# Patient Record
Sex: Male | Born: 1955 | Race: White | Hispanic: No | Marital: Single | Smoking: Current every day smoker
Health system: Southern US, Community
[De-identification: ages and names within clinical notes are randomized; demographics above are authoritative.]

## PROBLEM LIST (undated history)

## (undated) DIAGNOSIS — J449 Chronic obstructive pulmonary disease, unspecified: Secondary | ICD-10-CM

## (undated) DIAGNOSIS — E119 Type 2 diabetes mellitus without complications: Secondary | ICD-10-CM

## (undated) DIAGNOSIS — J189 Pneumonia, unspecified organism: Secondary | ICD-10-CM

## (undated) DIAGNOSIS — E785 Hyperlipidemia, unspecified: Secondary | ICD-10-CM

## (undated) HISTORY — PX: TONSILLECTOMY: SUR1361

---

## 2002-08-04 ENCOUNTER — Encounter: Admission: RE | Admit: 2002-08-04 | Discharge: 2002-11-02 | Payer: Self-pay | Admitting: Family Medicine

## 2013-05-21 ENCOUNTER — Encounter: Payer: Self-pay | Admitting: Emergency Medicine

## 2013-05-21 ENCOUNTER — Emergency Department (INDEPENDENT_AMBULATORY_CARE_PROVIDER_SITE_OTHER): Payer: BC Managed Care – PPO

## 2013-05-21 ENCOUNTER — Emergency Department
Admission: EM | Admit: 2013-05-21 | Discharge: 2013-05-21 | Disposition: A | Discharge status: Home / Self Care | Payer: BC Managed Care – PPO | Source: Home / Self Care | Attending: Family Medicine | Admitting: Family Medicine

## 2013-05-21 DIAGNOSIS — J189 Pneumonia, unspecified organism: Secondary | ICD-10-CM

## 2013-05-21 DIAGNOSIS — R918 Other nonspecific abnormal finding of lung field: Secondary | ICD-10-CM

## 2013-05-21 DIAGNOSIS — J441 Chronic obstructive pulmonary disease with (acute) exacerbation: Secondary | ICD-10-CM

## 2013-05-21 DIAGNOSIS — Z716 Tobacco abuse counseling: Secondary | ICD-10-CM

## 2013-05-21 DIAGNOSIS — E785 Hyperlipidemia, unspecified: Secondary | ICD-10-CM | POA: Insufficient documentation

## 2013-05-21 HISTORY — DX: Hyperlipidemia, unspecified: E78.5

## 2013-05-21 HISTORY — DX: Type 2 diabetes mellitus without complications: E11.9

## 2013-05-21 MED ORDER — METHYLPREDNISOLONE SODIUM SUCC 125 MG IJ SOLR
125.0000 mg | Freq: Once | INTRAMUSCULAR | Status: AC
  Administered 2013-05-21: 125 mg via INTRAMUSCULAR

## 2013-05-21 MED ORDER — CEFTRIAXONE SODIUM 1 G IJ SOLR
1.0000 g | Freq: Once | INTRAMUSCULAR | Status: AC
  Administered 2013-05-21: 1 g via INTRAMUSCULAR

## 2013-05-21 MED ORDER — AZITHROMYCIN 250 MG PO TABS: ORAL_TABLET | ORAL | Status: DC

## 2013-05-21 MED ORDER — ALBUTEROL SULFATE HFA 108 (90 BASE) MCG/ACT IN AERS: 2.0000 | INHALATION_SPRAY | RESPIRATORY_TRACT | Status: DC | PRN

## 2013-05-21 MED ORDER — IPRATROPIUM-ALBUTEROL 0.5-2.5 (3) MG/3ML IN SOLN
3.0000 mL | Freq: Once | RESPIRATORY_TRACT | Status: AC
  Administered 2013-05-21: 3 mL via RESPIRATORY_TRACT

## 2013-05-21 MED ORDER — PREDNISONE 50 MG PO TABS: ORAL_TABLET | ORAL | Status: DC

## 2013-05-21 NOTE — ED Provider Notes (Signed)
CSN: [id number]     Arrival date & time 05/21/13  1351 History   First MD Initiated Contact with Patient 05/21/13 1355     Chief Complaint  Patient presents with  . Fever    x 3 days  . Generalized Body Aches    x 3 days  . Shortness of Breath    x 3 days    HPI  URI Symptoms Onset: 3-4 days  Description: dyspnea, fever, body aches, wheezing  Modifying factors:  Smoker, baseline diabetic   Symptoms Nasal discharge: minimal  Fever: subjective  Sore throat: no Cough: yes Wheezing: no Ear pain: no GI symptoms: no Sick contacts: no  Red Flags  Stiff neck: no Dyspnea: yes Rash: no Swallowing difficulty: no  Sinusitis Risk Factors Headache/face pain: no Double sickening: no tooth pain: no  Allergy Risk Factors Sneezing: no Itchy scratchy throat: no Seasonal symptoms: no  Flu Risk Factors Headache: no muscle aches: no severe fatigue: no   Past Medical History  Diagnosis Date  . Diabetes mellitus without complication   . Hyperlipidemia    History reviewed. No pertinent past surgical history. History reviewed. No pertinent family history. History  Substance Use Topics  . Smoking status: Current Every Day Smoker -- 1.00 packs/day for 35 years    Types: Cigarettes  . Smokeless tobacco: Never Used  . Alcohol Use: No    Review of Systems  All other systems reviewed and are negative.    Allergies  Review of patient's allergies indicates no known allergies.  Home Medications   Current Outpatient Rx  Name  Route  Sig  Dispense  Refill  . atorvastatin (LIPITOR) 10 MG tablet   Oral   Take 10 mg by mouth daily.         Marland Kitchen glipiZIDE (GLUCOTROL XL) 5 MG 24 hr tablet   Oral   Take 5 mg by mouth daily with breakfast.         . Saxagliptin-Metformin (KOMBIGLYZE XR) 2.08-998 MG TB24   Oral   Take 2.5-1,000 mg by mouth daily.          BP 136/80  Pulse 125  Temp(Src) 98.8 F (37.1 C) (Oral)  Ht 6\' 2"  (1.88 m)  Wt 257 lb (116.574 kg)  BMI  32.98 kg/m2  SpO2 96% Physical Exam  Constitutional: He appears well-developed and well-nourished.  HENT:  Head: Normocephalic and atraumatic.  Eyes: Conjunctivae are normal. Pupils are equal, round, and reactive to light.  Neck: Normal range of motion. Neck supple.  Cardiovascular: Normal rate.   Pulmonary/Chest: He has wheezes.  Mild increased WOB    Abdominal: Soft.  Musculoskeletal: Normal range of motion.  Neurological: He is alert.  Skin: Skin is warm.    ED Course  Procedures (including critical care time) Labs Review Labs Reviewed - No data to display Imaging Review Dg Chest 2 View  05/21/2013   CLINICAL DATA:  Cough and fever  EXAM: CHEST  2 VIEW  COMPARISON:  None.  FINDINGS: The cardiac shadow is within normal limits. The lungs are mildly hyperinflated. Some infiltrate is noted in the posterior costophrenic angle. It appears to lie within left lung base although difficult to assess from the frontal film. No other focal infiltrate is seen. Mild degenerative change of the thoracic spine is noted.  IMPRESSION: Lower lobe infiltrate best seen on the lateral projection.   Electronically Signed   By: Alcide Clever M.D.   On: 05/21/2013 14:49  MDM   1. Pneumonia   2. COPD exacerbation   3. CAP (community acquired pneumonia)   4. Tobacco abuse counseling    LLL PNA on imaging.  Will place on zpak  Rocephin 1gm IM x1 Duoneb treatment x1 with improvement in aeration s/p treatment.  Solumedrol 125mg  Im x1 Will place on course of prednisone.  Discussed infectious and resp red flags at length.  Also discussed smoking cessation.  Plan for am follow up with PCP. Go to ER if sxs worsen.     The patient and/or caregiver has been counseled thoroughly with regard to treatment plan and/or medications prescribed including dosage, schedule, interactions, rationale for use, and possible side effects and they verbalize understanding. Diagnoses and expected course of recovery  discussed and will return if not improved as expected or if the condition worsens. Patient and/or caregiver verbalized understanding.         Doree AlbeeSteven Hymie Gorr, MD 05/21/13 1531

## 2013-05-21 NOTE — ED Notes (Signed)
Willie Thomas complains of fevers, body aches, dizziness, shortness of breath, nausea and vomiting for 3 days. He has taken NyQuil with little help.

## 2014-06-06 ENCOUNTER — Emergency Department (INDEPENDENT_AMBULATORY_CARE_PROVIDER_SITE_OTHER): Payer: BLUE CROSS/BLUE SHIELD

## 2014-06-06 ENCOUNTER — Emergency Department (INDEPENDENT_AMBULATORY_CARE_PROVIDER_SITE_OTHER)
Admission: EM | Admit: 2014-06-06 | Discharge: 2014-06-06 | Disposition: A | Discharge status: Home / Self Care | Payer: BLUE CROSS/BLUE SHIELD | Source: Home / Self Care | Attending: Emergency Medicine | Admitting: Emergency Medicine

## 2014-06-06 ENCOUNTER — Encounter: Payer: Self-pay | Admitting: Emergency Medicine

## 2014-06-06 DIAGNOSIS — J209 Acute bronchitis, unspecified: Secondary | ICD-10-CM

## 2014-06-06 DIAGNOSIS — R05 Cough: Secondary | ICD-10-CM

## 2014-06-06 DIAGNOSIS — R0602 Shortness of breath: Secondary | ICD-10-CM

## 2014-06-06 HISTORY — DX: Pneumonia, unspecified organism: J18.9

## 2014-06-06 MED ORDER — ALBUTEROL SULFATE HFA 108 (90 BASE) MCG/ACT IN AERS: 2.0000 | INHALATION_SPRAY | RESPIRATORY_TRACT | Status: DC | PRN

## 2014-06-06 MED ORDER — PREDNISONE 50 MG PO TABS: ORAL_TABLET | ORAL | Status: DC

## 2014-06-06 MED ORDER — AZITHROMYCIN 250 MG PO TABS: ORAL_TABLET | ORAL | Status: DC

## 2014-06-06 MED ORDER — BENZONATATE 200 MG PO CAPS: ORAL_CAPSULE | ORAL | Status: DC

## 2014-06-06 NOTE — ED Notes (Signed)
Reports 5 day history of coughing with shortness of breath, which he says is similar to previous experience with pneumonia. Having body aches, sore throat, chills, and some nausea; denies nasal congestion.

## 2014-06-06 NOTE — ED Provider Notes (Signed)
CSN: [id number]     Arrival date & time 06/06/14  1505 History   First MD Initiated Contact with Patient 06/06/14 1526     Chief Complaint  Patient presents with  . Shortness of Breath  . Cough  . Generalized Body Aches  . Fatigue   (Consider location/radiation/quality/duration/timing/severity/associated sxs/prior Treatment) HPI URI HISTORY  Willie Thomas is a 59 y.o. male who complains of onset of chest cold symptoms for 5 days.  Have been using over-the-counter treatment which helps a little bit. Reports 5 day history of coughing with shortness of breath, which he says is similar to previous experience with pneumonia. He requests chest x-ray today.  Having body aches, sore throat, chills, and some nausea; denies nasal congestion. No chills/sweats +  Fever  No  Nasal congestion No  Discolored Post-nasal drainage No sinus pain/pressure No sore throat  +  Cough, productive of yellow-green sputum Mild, occasional wheezing Positive chest congestion No hemoptysis No current shortness of breath No pleuritic pain  No itchy/red eyes No earache  No nausea No vomiting No abdominal pain No diarrhea  No skin rashes +  Fatigue No myalgias No headache   Past Medical History  Diagnosis Date  . Diabetes mellitus without complication   . Hyperlipidemia   . Pneumonia    Past Surgical History  Procedure Laterality Date  . Tonsillectomy     History reviewed. No pertinent family history. History  Substance Use Topics  . Smoking status: Current Every Day Smoker -- 1.00 packs/day for 35 years    Types: Cigarettes  . Smokeless tobacco: Never Used  . Alcohol Use: No    Review of Systems  All other systems reviewed and are negative.   Allergies  Review of patient's allergies indicates no known allergies.  Home Medications   Prior to Admission medications   Medication Sig Start Date End Date Taking? Authorizing Provider  albuterol (PROVENTIL HFA;VENTOLIN HFA) 108 (90 BASE)  MCG/ACT inhaler Inhale 2 puffs into the lungs every 4 (four) hours as needed for wheezing or shortness of breath. 06/06/14   Lajean Manesavid Massey, MD  atorvastatin (LIPITOR) 10 MG tablet Take 10 mg by mouth daily.    Historical Provider, MD  azithromycin (ZITHROMAX Z-PAK) 250 MG tablet Take 2 tablets on day one, then 1 tablet daily on days 2 through 5 06/06/14   Lajean Manesavid Massey, MD  benzonatate (TESSALON) 200 MG capsule Take 1 every 8 hours as needed for cough. 06/06/14   Lajean Manesavid Massey, MD  predniSONE (DELTASONE) 50 MG tablet Starting today, take 1 by mouth (with food) for 6 days. 06/06/14   Lajean Manesavid Massey, MD  Saxagliptin-Metformin (KOMBIGLYZE XR) 2.08-998 MG TB24 Take 2.5-1,000 mg by mouth daily.    Historical Provider, MD   BP 111/72 mmHg  Pulse 94  Temp(Src) 98 F (36.7 C) (Oral)  Resp 18  Ht 6\' 2"  (1.88 m)  Wt 250 lb (113.399 kg)  BMI 32.08 kg/m2  SpO2 98% Physical Exam  Constitutional: He is oriented to person, place, and time. He appears well-developed and well-nourished. No distress.  HENT:  Head: Normocephalic and atraumatic.  Right Ear: Tympanic membrane normal.  Left Ear: Tympanic membrane normal.  Nose: Nose normal.  Mouth/Throat: Oropharynx is clear and moist. No oropharyngeal exudate.  Eyes: Right eye exhibits no discharge. Left eye exhibits no discharge. No scleral icterus.  Neck: Neck supple.  Cardiovascular: Normal rate, regular rhythm and normal heart sounds.   Pulmonary/Chest: No respiratory distress. He has wheezes (Rare late expiratory wheeze heard  only on forced expiration, otherwise breath sounds equal bilaterally. Oxygen saturation 98% on room air). He has rhonchi. He has no rales.  Abdominal: He exhibits no distension.  Musculoskeletal: He exhibits no edema.  Lymphadenopathy:    He has no cervical adenopathy.  Neurological: He is alert and oriented to person, place, and time.  Skin: Skin is warm and dry. No rash noted.  Nursing note and vitals reviewed.   ED Course   Procedures (including critical care time) Labs Review Labs Reviewed - No data to display  Imaging Review Dg Chest 2 View  06/06/2014   CLINICAL DATA:  One week is shortness of breath and cough. History of smoking.  EXAM: CHEST  2 VIEW  COMPARISON:  06/05/2013 and 06/12/2013  FINDINGS: Heart size is normal. There are no focal consolidations or pleural effusions. No pulmonary edema. 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 Degenerative changes are seen in the thoracic spine.  IMPRESSION: No active cardiopulmonary disease.   Electronically Signed   By: Norva Pavlov M.D.   On: 06/06/2014 15:46     MDM   1. Acute bronchitis, unspecified organism     Chest x-ray shows no acute infiltrates. Clinically, he has acute bronchitis with rare bronchospasm but pulse ox normal 98% and breath sounds equal bilaterally. Treatment options discussed, as well as risks, benefits, alternatives. Patient voiced understanding and agreement with the following plans: Azithromycin Z-Pak Tessalon pearls 200 mg every 8 hours when necessary cough Prednisone 50 mg daily 6 days, take with food. Albuterol HFA, 2 puffs every 4-6 hours when necessary wheezing He declined any IM treatment or nebulizer treatment here in urgent care today. Follow-up with your primary care doctor in 2-3 days if not improving, or sooner if symptoms become worse. 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 Red flags discussed.--- Emergency room if any red flag Questions invited and answered. Patient voiced understanding and agreement.   Lajean Manes, MD 06/06/14 2033

## 2015-10-14 ENCOUNTER — Encounter: Payer: Self-pay | Admitting: *Deleted

## 2015-10-14 ENCOUNTER — Emergency Department
Admission: EM | Admit: 2015-10-14 | Discharge: 2015-10-14 | Disposition: A | Discharge status: Home / Self Care | Payer: BLUE CROSS/BLUE SHIELD | Source: Home / Self Care

## 2015-10-14 DIAGNOSIS — B9789 Other viral agents as the cause of diseases classified elsewhere: Principal | ICD-10-CM

## 2015-10-14 DIAGNOSIS — J069 Acute upper respiratory infection, unspecified: Secondary | ICD-10-CM

## 2015-10-14 MED ORDER — DOXYCYCLINE HYCLATE 100 MG PO CAPS: 100.0000 mg | ORAL_CAPSULE | Freq: Two times a day (BID) | ORAL | Status: DC

## 2015-10-14 MED ORDER — PREDNISONE 50 MG PO TABS: ORAL_TABLET | ORAL | Status: DC

## 2015-10-14 NOTE — ED Notes (Signed)
Pt c/o 2 days of productive cough and congestion. Sore throat yesterday which has now resolved. Nausea at times. Afebrile. No otc meds taken.

## 2015-10-14 NOTE — Discharge Instructions (Signed)
Take plain guaifenesin (1200mg extended release tabs such as Mucinex) twice daily, with plenty of water, for cough and congestion.  May add Pseudoephedrine (30mg, one or two every 4 to 6 hours) for sinus congestion.  Get adequate rest.   °May use Afrin nasal spray (or generic oxymetazoline) twice daily for about 5 days and then discontinue.  Also recommend using saline nasal spray several times daily and saline nasal irrigation (AYR is a common brand).  Use Flonase nasal spray each morning after using Afrin nasal spray and saline nasal irrigation. °Try warm salt water gargles for sore throat.  °Stop all antihistamines for now, and other non-prescription cough/cold preparations. °May take Delsym Cough Suppressant at bedtime for nighttime cough.  °Follow-up with family doctor if not improving about10 days.  °

## 2015-10-14 NOTE — ED Provider Notes (Signed)
CSN: [id number]     Arrival date & time 10/14/15  1306 History   None    Chief Complaint  Patient presents with  . Cough   (Consider location/radiation/quality/duration/timing/severity/associated sxs/prior Treatment) HPI  Past Medical History  Diagnosis Date  . Diabetes mellitus without complication (HCC)   . Hyperlipidemia   . Pneumonia    Past Surgical History  Procedure Laterality Date  . Tonsillectomy     History reviewed. No pertinent family history. Social History  Substance Use Topics  . Smoking status: Current Every Day Smoker -- 1.00 packs/day for 35 years    Types: Cigarettes  . Smokeless tobacco: Never Used  . Alcohol Use: No    Review of Systems  Allergies  Review of patient's allergies indicates no known allergies.  Home Medications   Prior to Admission medications   Medication Sig Start Date End Date Taking? Authorizing Provider  doxycycline (VIBRAMYCIN) 100 MG capsule Take 1 capsule (100 mg total) by mouth 2 (two) times daily. Take with food. 10/14/15   Lattie HawStephen A Anthonymichael Munday, MD  predniSONE (DELTASONE) 50 MG tablet Take one tab by mouth with food once daily for five days 10/14/15   Lattie HawStephen A Nathali Vent, MD   Meds Ordered and Administered this Visit  Medications - No data to display  BP 102/65 mmHg  Pulse 100  Temp(Src) 98.6 F (37 C) (Oral)  Wt 235 lb (106.595 kg)  SpO2 96% No data found.   Physical Exam  ED Course  Procedures (including critical care time)  Labs Review Labs Reviewed - No data to display  Imaging Review No results found.   Visual Acuity Review  Right Eye Distance:   Left Eye Distance:   Bilateral Distance:    Right Eye Near:   Left Eye Near:    Bilateral Near:         MDM   1. Viral URI with cough    With a past history of pneumonia and bronchitis, begin doxycycline for atypical coverage. Prednisone burst. Take plain guaifenesin (1200mg  extended release tabs such as Mucinex) twice daily, with plenty of water, for  cough and congestion.  May add Pseudoephedrine (30mg , one or two every 4 to 6 hours) for sinus congestion.  Get adequate rest.   May use Afrin nasal spray (or generic oxymetazoline) twice daily for about 5 days and then discontinue.  Also recommend using saline nasal spray several times daily and saline nasal irrigation (AYR is a common brand).  Use Flonase nasal spray each morning after using Afrin nasal spray and saline nasal irrigation. Try warm salt water gargles for sore throat.  Stop all antihistamines for now, and other non-prescription cough/cold preparations. May take Delsym Cough Suppressant at bedtime for nighttime cough.  Follow-up with family doctor if not improving about10 days.     Lattie HawStephen A Morganne Haile, MD 10/14/15 1356

## 2015-12-24 ENCOUNTER — Encounter: Payer: Self-pay | Admitting: Emergency Medicine

## 2015-12-24 ENCOUNTER — Emergency Department (INDEPENDENT_AMBULATORY_CARE_PROVIDER_SITE_OTHER): Payer: BLUE CROSS/BLUE SHIELD

## 2015-12-24 ENCOUNTER — Emergency Department (INDEPENDENT_AMBULATORY_CARE_PROVIDER_SITE_OTHER)
Admission: EM | Admit: 2015-12-24 | Discharge: 2015-12-24 | Disposition: A | Discharge status: Home / Self Care | Payer: BLUE CROSS/BLUE SHIELD | Source: Home / Self Care | Attending: Family Medicine | Admitting: Family Medicine

## 2015-12-24 DIAGNOSIS — R0981 Nasal congestion: Secondary | ICD-10-CM

## 2015-12-24 DIAGNOSIS — J441 Chronic obstructive pulmonary disease with (acute) exacerbation: Secondary | ICD-10-CM | POA: Diagnosis not present

## 2015-12-24 DIAGNOSIS — R0602 Shortness of breath: Secondary | ICD-10-CM | POA: Diagnosis not present

## 2015-12-24 DIAGNOSIS — J40 Bronchitis, not specified as acute or chronic: Secondary | ICD-10-CM

## 2015-12-24 MED ORDER — BENZONATATE 200 MG PO CAPS: 200.0000 mg | ORAL_CAPSULE | Freq: Every day | ORAL | 0 refills | Status: DC

## 2015-12-24 MED ORDER — METHYLPREDNISOLONE SODIUM SUCC 125 MG IJ SOLR
125.0000 mg | Freq: Once | INTRAMUSCULAR | Status: AC
  Administered 2015-12-24: 125 mg via INTRAMUSCULAR

## 2015-12-24 MED ORDER — PREDNISONE 50 MG PO TABS: ORAL_TABLET | ORAL | 0 refills | Status: DC

## 2015-12-24 MED ORDER — AZITHROMYCIN 250 MG PO TABS: ORAL_TABLET | ORAL | 0 refills | Status: DC

## 2015-12-24 MED ORDER — ALBUTEROL SULFATE HFA 108 (90 BASE) MCG/ACT IN AERS: 2.0000 | INHALATION_SPRAY | RESPIRATORY_TRACT | 0 refills | Status: DC | PRN

## 2015-12-24 MED ORDER — IPRATROPIUM-ALBUTEROL 0.5-2.5 (3) MG/3ML IN SOLN
3.0000 mL | Freq: Once | RESPIRATORY_TRACT | Status: AC
  Administered 2015-12-24: 3 mL via RESPIRATORY_TRACT

## 2015-12-24 NOTE — Discharge Instructions (Signed)
Begin prednisone Sunday December 25, 2015. Take plain guaifenesin (1200mg  extended release tabs such as Mucinex) twice daily, with plenty of water, for cough and congestion.  Get adequate rest.   May use Afrin nasal spray (or generic oxymetazoline) twice daily for about 5 days and then discontinue.  Also recommend using saline nasal spray several times daily and saline nasal irrigation (AYR is a common brand).  Use Flonase nasal spray each morning after using Afrin nasal spray and saline nasal irrigation. Try warm salt water gargles for sore throat.  Continue albuterol inhaler as needed. Stop all antihistamines for now, and other non-prescription cough/cold preparations.

## 2015-12-24 NOTE — ED Provider Notes (Signed)
[facility]    CSN: [id number] Arrival date & time: 12/24/15  1516  First Provider Contact:  First MD Initiated Contact with Patient 12/24/15 1714        History   Chief Complaint Chief Complaint  Patient presents with  . Nasal Congestion  . Shortness of Breath    HPI Willie Thomas is a 59 y.o. male.   About one week ago patient developed typical cold-like symptoms developing over several days,  including mild sore throat, sinus congestion, headache, fatigue, and cough.  He has now developed wheezing and shortness of breath with activity.      Past Medical History:  Diagnosis Date  . Diabetes mellitus without complication (HCC)   . Hyperlipidemia   . Pneumonia     Patient Active Problem List   Diagnosis Date Noted  . Hyperlipidemia     Past Surgical History:  Procedure Laterality Date  . TONSILLECTOMY         Home Medications    Prior to Admission medications   Medication Sig Start Date End Date Taking? Authorizing Provider  albuterol (PROVENTIL HFA;VENTOLIN HFA) 108 (90 Base) MCG/ACT inhaler Inhale 2 puffs into the lungs every 4 (four) hours as needed for wheezing or shortness of breath. 12/24/15   Lattie Haw, MD  azithromycin (ZITHROMAX Z-PAK) 250 MG tablet Take 2 tabs today; then begin one tab once daily for 4 more days. 12/24/15   Lattie Haw, MD  benzonatate (TESSALON) 200 MG capsule Take 1 capsule (200 mg total) by mouth at bedtime. Take as needed for cough 12/24/15   Lattie Haw, MD  predniSONE (DELTASONE) 50 MG tablet Take one tab by mouth with food once daily for five days 12/24/15   Lattie Haw, MD    Family History History reviewed. No pertinent family history.  Social History Social History  Substance Use Topics  . Smoking status: Current Every Day Smoker    Packs/day: 1.00    Years: 35.00    Types: Cigarettes  . Smokeless tobacco: Never Used  . Alcohol use No     Allergies   Review of patient's allergies  indicates no known allergies.   Review of Systems Review of Systems + sore throat, resolved + cough No pleuritic pain + wheezing + nasal congestion + post-nasal drainage No sinus pain/pressure No itchy/red eyes No earache No hemoptysis + SOB with activity No fever, + chills No nausea No vomiting No abdominal pain No diarrhea No urinary symptoms No skin rash + fatigue No myalgias + headache Used OTC meds without relief   Physical Exam Triage Vital Signs ED Triage Vitals  Enc Vitals Group     BP 12/24/15 1548 100/63     Pulse Rate 12/24/15 1548 84     Resp 12/24/15 1548 18     Temp 12/24/15 1548 97.6 F (36.4 C)     Temp Source 12/24/15 1548 Oral     SpO2 12/24/15 1548 95 %     Weight 12/24/15 1549 230 lb (104.3 kg)     Height 12/24/15 1549 6\' 2"  (1.88 m)     Head Circumference --      Peak Flow --      Pain Score --      Pain Loc --      Pain Edu? --      Excl. in GC? --    No data found.   Updated Vital Signs BP 100/63 (BP Location: Left Arm)  Pulse 84   Temp 97.6 F (36.4 C) (Oral)   Resp 18   Ht 6\' 2"  (1.88 m)   Wt 230 lb (104.3 kg)   SpO2 95%   BMI 29.53 kg/m   Visual Acuity Right Eye Distance:   Left Eye Distance:   Bilateral Distance:    Right Eye Near:   Left Eye Near:    Bilateral Near:     Physical Exam Nursing notes and Vital Signs reviewed. Appearance:  Patient appears stated age, and in no acute distress Eyes:  Pupils are equal, round, and reactive to light and accomodation.  Extraocular movement is intact.  Conjunctivae are not inflamed  Ears:  Canals normal.  Tympanic membranes normal.  Nose:  Mildly congested turbinates.  No sinus tenderness.   Pharynx:  Normal Neck:  Supple.  Tender enlarged posterior/lateral nodes are palpated bilaterally  Lungs:   Tubular breath sounds; faint scattered wheezes.  Breath sounds are equal.  Moving air well. Heart:  Regular rate and rhythm without murmurs, rubs, or gallops.  Abdomen:   Nontender without masses or hepatosplenomegaly.  Bowel sounds are present.  No CVA or flank tenderness.  Extremities:  No edema.  Skin:  No rash present.    UC Treatments / Results  Labs (all labs ordered are listed, but only abnormal results are displayed) Labs Reviewed - No data to display  EKG  EKG Interpretation None       Radiology Dg Chest 2 View  Result Date: 12/24/2015 CLINICAL DATA:  Shortness of breath x1 week, nasal congestion EXAM: CHEST  2 VIEW COMPARISON:  06/06/2014 FINDINGS: Lungs are clear.  No pleural effusion or pneumothorax. The heart is normal in size. Degenerative changes of the visualized thoracolumbar spine. IMPRESSION: No evidence of acute cardiopulmonary disease. Electronically Signed   By: Charline Bills M.D.   On: 12/24/2015 16:08    Procedures Procedures (including critical care time)  Medications Ordered in UC Medications  methylPREDNISolone sodium succinate (SOLU-MEDROL) 125 mg/2 mL injection 125 mg (125 mg Intramuscular Given 12/24/15 1740)  ipratropium-albuterol (DUONEB) 0.5-2.5 (3) MG/3ML nebulizer solution 3 mL (3 mLs Nebulization Given 12/24/15 1740)     Initial Impression / Assessment and Plan / UC Course  I have reviewed the triage vital signs and the nursing notes.  Pertinent labs & imaging results that were available during my care of the patient were reviewed by me and considered in my medical decision making (see chart for details).  Clinical Course  Administered DuoNeb by hand held nebulizer.  Administered SoluMedrol 125mg  IM Begin prednisone 50mg  daily burst tomorrow for 5 days. Refill albuterol inhaler. Prescription written for Benzon[facility]) to take at bedtime for night-time cough.  Begin prednisone Sunday December 25, 2015. Take plain guaifenesin (1200mg  extended release tabs such as Mucinex) twice daily, with plenty of water, for cough and congestion.  Get adequate rest.   May use Afrin nasal spray (or generic  oxymetazoline) twice daily for about 5 days and then discontinue.  Also recommend using saline nasal spray several times daily and saline nasal irrigation (AYR is a common brand).  Use Flonase nasal spray each morning after using Afrin nasal spray and saline nasal irrigation. Try warm salt water gargles for sore throat.  Continue albuterol inhaler as needed. Stop all antihistamines for now, and other non-prescription cough/cold preparations. Followup with Family Doctor if not improved in about 6 days.    Final Clinical Impressions(s) / UC Diagnoses   Final diagnoses:  COPD exacerbation (HCC)  Bronchitis    New Prescriptions New Prescriptions   ALBUTEROL (PROVENTIL HFA;VENTOLIN HFA) 108 (90 BASE) MCG/ACT INHALER    Inhale 2 puffs into the lungs every 4 (four) hours as needed for wheezing or shortness of breath.   AZITHROMYCIN (ZITHROMAX Z-PAK) 250 MG TABLET    Take 2 tabs today; then begin one tab once daily for 4 more days.   BENZONATATE (TESSALON) 200 MG CAPSULE    Take 1 capsule (200 mg total) by mouth at bedtime. Take as needed for cough     Lattie HawStephen A Rogerio Boutelle, MD 01/01/16 [phone]

## 2015-12-24 NOTE — ED Triage Notes (Signed)
Patient states he has had nasal congestion for a week; most troublesome is dyspnea when trying to climb stairs or be active.

## 2016-04-24 ENCOUNTER — Emergency Department (INDEPENDENT_AMBULATORY_CARE_PROVIDER_SITE_OTHER)
Admission: EM | Admit: 2016-04-24 | Discharge: 2016-04-24 | Disposition: A | Discharge status: Home / Self Care | Payer: BLUE CROSS/BLUE SHIELD | Source: Home / Self Care | Attending: Family Medicine | Admitting: Family Medicine

## 2016-04-24 ENCOUNTER — Emergency Department (INDEPENDENT_AMBULATORY_CARE_PROVIDER_SITE_OTHER): Payer: BLUE CROSS/BLUE SHIELD

## 2016-04-24 DIAGNOSIS — M4607 Spinal enthesopathy, lumbosacral region: Secondary | ICD-10-CM | POA: Diagnosis not present

## 2016-04-24 DIAGNOSIS — M5441 Lumbago with sciatica, right side: Secondary | ICD-10-CM | POA: Diagnosis not present

## 2016-04-24 DIAGNOSIS — M4606 Spinal enthesopathy, lumbar region: Secondary | ICD-10-CM | POA: Diagnosis not present

## 2016-04-24 MED ORDER — PREDNISONE 20 MG PO TABS
ORAL_TABLET | ORAL | 0 refills | Status: DC
Start: 2016-04-24 — End: 2017-03-24

## 2016-04-24 NOTE — Discharge Instructions (Signed)
Apply ice pack for 20 to 30 minutes, 3 to 4 times daily  Continue until pain and swelling decrease.  °

## 2016-04-24 NOTE — ED Triage Notes (Signed)
Has been having pain in right hip for a couple of months.  Resolved, then 2 weeks ago became worse.  It is around his belt line and down his leg.  It is worse in the am.

## 2016-04-24 NOTE — ED Provider Notes (Signed)
[facility]    CSN: [id number] Arrival date & time: 04/24/16  1413     History   Chief Complaint Chief Complaint  Patient presents with  . Hip Pain    right    HPI Willie Thomas is a 61 y.o. male.   Patient complains of onset 6 weeks ago of intermittent pain in his right hip radiating to his right leg with certain movements.  The pain resolved, then recurred 2 weeks ago, becoming even worse 2 days ago.  The pain is worse each morning.   He denies bowel or bladder dysfunction, and no saddle numbness.    The history is provided by the patient.  Back Pain  Location:  Gluteal region Quality:  Aching Radiates to:  R posterior upper leg Pain severity:  Moderate Pain is:  Worse during the day Onset quality:  Gradual Duration:  6 weeks Timing:  Intermittent Progression:  Worsening Context: not lifting heavy objects, not occupational injury and not recent injury   Relieved by: supine. Worsened by:  Movement Ineffective treatments:  None tried Associated symptoms: leg pain and paresthesias   Associated symptoms: no abdominal pain, no bladder incontinence, no bowel incontinence, no dysuria, no fever, no numbness, no pelvic pain, no perianal numbness, no tingling, no weakness and no weight loss     Past Medical History:  Diagnosis Date  . Diabetes mellitus without complication (HCC)   . Hyperlipidemia   . Pneumonia     Patient Active Problem List   Diagnosis Date Noted  . Hyperlipidemia     Past Surgical History:  Procedure Laterality Date  . TONSILLECTOMY         Home Medications    Prior to Admission medications   Medication Sig Start Date End Date Taking? Authorizing Provider  albuterol (PROVENTIL HFA;VENTOLIN HFA) 108 (90 Base) MCG/ACT inhaler Inhale 2 puffs into the lungs every 4 (four) hours as needed for wheezing or shortness of breath. 12/24/15   Lattie HawStephen A Dwayne Begay, MD  azithromycin (ZITHROMAX Z-PAK) 250 MG tablet Take 2 tabs today; then begin  one tab once daily for 4 more days. 12/24/15   Lattie HawStephen A Crystie Yanko, MD  benzonatate (TESSALON) 200 MG capsule Take 1 capsule (200 mg total) by mouth at bedtime. Take as needed for cough 12/24/15   Lattie HawStephen A Camy Leder, MD  predniSONE (DELTASONE) 20 MG tablet Take one tab by mouth twice daily for 5 days, then one daily. Take with food. 04/24/16   Lattie HawStephen A Ellawyn Wogan, MD    Family History History reviewed. No pertinent family history.  Social History Social History  Substance Use Topics  . Smoking status: Current Every Day Smoker    Packs/day: 1.00    Years: 35.00    Types: Cigarettes  . Smokeless tobacco: Never Used  . Alcohol use No     Allergies   Patient has no known allergies.   Review of Systems Review of Systems  Constitutional: Negative for fever and weight loss.  Gastrointestinal: Negative for abdominal pain and bowel incontinence.  Genitourinary: Negative for bladder incontinence, dysuria and pelvic pain.  Musculoskeletal: Positive for back pain.  Neurological: Positive for paresthesias. Negative for tingling, weakness and numbness.  All other systems reviewed and are negative.    Physical Exam Triage Vital Signs ED Triage Vitals  Enc Vitals Group     BP 04/24/16 1432 124/70     Pulse Rate 04/24/16 1432 85     Resp --      Temp  04/24/16 1432 98 F (36.7 C)     Temp Source 04/24/16 1432 Oral     SpO2 04/24/16 1432 96 %     Weight 04/24/16 1433 235 lb (106.6 kg)     Height 04/24/16 1433 6\' 2"  (1.88 m)     Head Circumference --      Peak Flow --      Pain Score 04/24/16 1434 5     Pain Loc --      Pain Edu? --      Excl. in GC? --    No data found.   Updated Vital Signs BP 124/70 (BP Location: Left Arm)   Pulse 85   Temp 98 F (36.7 C) (Oral)   Ht 6\' 2"  (1.88 m)   Wt 235 lb (106.6 kg)   SpO2 96%   BMI 30.17 kg/m   Visual Acuity Right Eye Distance:   Left Eye Distance:   Bilateral Distance:    Right Eye Near:   Left Eye Near:    Bilateral Near:      Physical Exam  Constitutional: He appears well-developed and well-nourished. No distress.  HENT:  Head: Normocephalic.  Right Ear: External ear normal.  Left Ear: External ear normal.  Nose: Nose normal.  Mouth/Throat: Oropharynx is clear and moist.  Eyes: Conjunctivae are normal. Pupils are equal, round, and reactive to light.  Neck: Normal range of motion.  Cardiovascular: Normal heart sounds.   Pulmonary/Chest: Breath sounds normal.  Abdominal: There is no tenderness.  Musculoskeletal: He exhibits no edema.       Back:  Back:  Range of motion relatively well preserved.  Can heel/toe walk and squat without difficulty.  Patient experiences pain in marked location, but there is no tenderness to palpation there.    Straight leg raising test is negative.  Sitting knee extension test is positive at about 45 degrees from horizontal.  Strength and sensation in the lower extremities is normal.  Patellar reflexes are normal.  Decreased right ankle reflex.  FABER negative.   Neurological: He is alert.  Skin: Skin is warm and dry. No rash noted.  Nursing note and vitals reviewed.    UC Treatments / Results  Labs (all labs ordered are listed, but only abnormal results are displayed) Labs Reviewed - No data to display  EKG  EKG Interpretation None       Radiology Dg Lumbar Spine Complete  Result Date: 04/24/2016 CLINICAL DATA:  Right hip pain EXAM: LUMBAR SPINE - COMPLETE 4+ VIEW COMPARISON:  None. FINDINGS: Normal alignment. No fracture or pars defect. Mild disc degeneration in the lower lumbar spine. Mild spurring at L4-5 and L5-S1. IMPRESSION: Multilevel lumbar degenerative change.  No acute abnormality. Electronically Signed   By: Marlan Palau M.D.   On: 04/24/2016 15:57    Procedures Procedures (including critical care time)  Medications Ordered in UC Medications - No data to display   Initial Impression / Assessment and Plan / UC Course  I have reviewed the triage  vital signs and the nursing notes.  Pertinent labs & imaging results that were available during my care of the patient were reviewed by me and considered in my medical decision making (see chart for details).  Clinical Course   Begin prednisone burst/taper. Apply ice pack for 20 to 30 minutes, 3 to 4 times daily  Continue until pain and swelling decrease.   Followup with Dr. Rodney Langton or Dr. Clementeen [facility] ([facility]) if not improving about  two weeks.      Final Clinical Impressions(s) / UC Diagnoses   Final diagnoses:  Acute right-sided low back pain with right-sided sciatica    New Prescriptions New Prescriptions   PREDNISONE (DELTASONE) 20 MG TABLET    Take one tab by mouth twice daily for 5 days, then one daily. Take with food.     Lattie Haw, MD 05/14/16 [phone]

## 2017-03-24 ENCOUNTER — Emergency Department (INDEPENDENT_AMBULATORY_CARE_PROVIDER_SITE_OTHER)
Admission: EM | Admit: 2017-03-24 | Discharge: 2017-03-24 | Disposition: A | Discharge status: Home / Self Care | Payer: BLUE CROSS/BLUE SHIELD | Source: Home / Self Care | Attending: Family Medicine | Admitting: Family Medicine

## 2017-03-24 ENCOUNTER — Encounter: Payer: Self-pay | Admitting: Emergency Medicine

## 2017-03-24 DIAGNOSIS — R062 Wheezing: Secondary | ICD-10-CM

## 2017-03-24 DIAGNOSIS — J209 Acute bronchitis, unspecified: Secondary | ICD-10-CM

## 2017-03-24 MED ORDER — BENZONATATE 100 MG PO CAPS: 100.0000 mg | ORAL_CAPSULE | Freq: Three times a day (TID) | ORAL | 0 refills | Status: DC

## 2017-03-24 MED ORDER — PREDNISONE 20 MG PO TABS: ORAL_TABLET | ORAL | 0 refills | Status: DC

## 2017-03-24 MED ORDER — AZITHROMYCIN 250 MG PO TABS: 250.0000 mg | ORAL_TABLET | Freq: Every day | ORAL | 0 refills | Status: DC

## 2017-03-24 NOTE — ED Provider Notes (Signed)
[facility]    CSN: [id number] Arrival date & time: 03/24/17  1453     History   Chief Complaint Chief Complaint  Patient presents with  . Cough    HPI Willie Thomas is a 61 y.o. male.   HPI Willie Thomas is a 61 y.o. male presenting to UC with c/o 1 week of gradually worsening moderately productive cough with chest tightness and SOB.  Mild relief with Dayquil and Advil cold.  Denies fever, chills, n/v/d. Denies known sick contacts. No hx of asthma but has had an inhaler and prednisone prescribed in the past.  He has had relief with prednisone in the past but does not feel like the inhaler helps much.   Past Medical History:  Diagnosis Date  . Diabetes mellitus without complication (HCC)   . Hyperlipidemia   . Pneumonia     Patient Active Problem List   Diagnosis Date Noted  . Hyperlipidemia     Past Surgical History:  Procedure Laterality Date  . TONSILLECTOMY         Home Medications    Prior to Admission medications   Medication Sig Start Date End Date Taking? Authorizing Provider  azithromycin (ZITHROMAX) 250 MG tablet Take 1 tablet (250 mg total) by mouth daily. Take first 2 tablets together, then 1 every day until finished. 03/24/17   Lurene ShadowPhelps, Erma Joubert O, PA-C  benzonatate (TESSALON) 100 MG capsule Take 1-2 capsules (100-200 mg total) by mouth every 8 (eight) hours. 03/24/17   Lurene ShadowPhelps, Jash Wahlen O, PA-C  predniSONE (DELTASONE) 20 MG tablet 3 tabs po day one, then 2 po daily x 4 days 03/24/17   Lurene ShadowPhelps, Aberdeen Hafen O, PA-C    Family History No family history on file.  Social History Social History   Tobacco Use  . Smoking status: Current Every Day Smoker    Packs/day: 1.00    Years: 35.00    Pack years: 35.00    Types: Cigarettes  . Smokeless tobacco: Never Used  Substance Use Topics  . Alcohol use: No  . Drug use: No     Allergies   Patient has no known allergies.   Review of Systems Review of Systems  Constitutional: Negative for chills  and fever.  HENT: Positive for congestion and postnasal drip. Negative for ear pain, sore throat, trouble swallowing and voice change.   Respiratory: Positive for cough, chest tightness and shortness of breath.   Cardiovascular: Negative for chest pain and palpitations.  Gastrointestinal: Negative for abdominal pain, diarrhea, nausea and vomiting.  Musculoskeletal: Negative for arthralgias, back pain and myalgias.  Skin: Negative for rash.     Physical Exam Triage Vital Signs ED Triage Vitals  Enc Vitals Group     BP 03/24/17 1514 134/81     Pulse Rate 03/24/17 1514 95     Resp --      Temp 03/24/17 1514 (!) 97.5 F (36.4 C)     Temp Source 03/24/17 1514 Oral     SpO2 03/24/17 1514 92 %     Weight 03/24/17 1514 239 lb 12 oz (108.7 kg)     Height 03/24/17 1514 6\' 2"  (1.88 m)     Head Circumference --      Peak Flow --      Pain Score 03/24/17 1515 0     Pain Loc --      Pain Edu? --      Excl. in GC? --    No data found.  Updated  Vital Signs BP 134/81 (BP Location: Left Arm)   Pulse 95   Temp (!) 97.5 F (36.4 C) (Oral)   Ht 6\' 2"  (1.88 m)   Wt 239 lb 12 oz (108.7 kg)   SpO2 92%   BMI 30.78 kg/m   Visual Acuity Right Eye Distance:   Left Eye Distance:   Bilateral Distance:    Right Eye Near:   Left Eye Near:    Bilateral Near:     Physical Exam  Constitutional: He appears well-developed and well-nourished. No distress.  HENT:  Head: Normocephalic and atraumatic.  Right Ear: Tympanic membrane normal.  Left Ear: Tympanic membrane normal.  Nose: Nose normal.  Mouth/Throat: Uvula is midline, oropharynx is clear and moist and mucous membranes are normal.  Eyes: Conjunctivae are normal. No scleral icterus.  Neck: Normal range of motion.  Cardiovascular: Normal rate, regular rhythm and normal heart sounds.  Pulmonary/Chest: Effort normal. No stridor. No respiratory distress. He has decreased breath sounds in the right lower field and the left lower field. He has  wheezes. He has no rales. He exhibits no tenderness.  Abdominal: Soft. Bowel sounds are normal. He exhibits no distension and no mass. There is no tenderness. There is no rebound and no guarding.  Musculoskeletal: Normal range of motion.  Neurological: He is alert.  Skin: Skin is warm and dry. He is not diaphoretic.  Nursing note and vitals reviewed.    UC Treatments / Results  Labs (all labs ordered are listed, but only abnormal results are displayed) Labs Reviewed - No data to display  EKG  EKG Interpretation None       Radiology No results found.  Procedures Procedures (including critical care time)  Medications Ordered in UC Medications - No data to display   Initial Impression / Assessment and Plan / UC Course  I have reviewed the triage vital signs and the nursing notes.  Pertinent labs & imaging results that were available during my care of the patient were reviewed by me and considered in my medical decision making (see chart for details).     Wheeze and productive cough noted on exam Will cover for underlying bacterial infection, potentially early pneumonia given decreased breath sounds in lower lung fields.  Home care instructions provided F/u with PCP in 1 week if not improving.   Final Clinical Impressions(s) / UC Diagnoses   Final diagnoses:  Acute bronchitis, unspecified organism  Wheeze    ED Discharge Orders        Ordered    benzonatate (TESSALON) 100 MG capsule  Every 8 hours     03/24/17 1518    predniSONE (DELTASONE) 20 MG tablet     03/24/17 1518    azithromycin (ZITHROMAX) 250 MG tablet  Daily     03/24/17 1518       Controlled Substance Prescriptions [place] Controlled Substance Registry consulted? Not Applicable   Rolla Platehelps, Taquisha Phung O, PA-C 03/24/17 1545

## 2017-03-24 NOTE — ED Triage Notes (Signed)
Patient complaining of head congestion, SOB, productive cough x 1 week.  Taken Dayquil and Advil.

## 2017-03-27 ENCOUNTER — Encounter: Payer: Self-pay | Admitting: Emergency Medicine

## 2017-03-27 ENCOUNTER — Emergency Department (INDEPENDENT_AMBULATORY_CARE_PROVIDER_SITE_OTHER): Payer: BLUE CROSS/BLUE SHIELD

## 2017-03-27 ENCOUNTER — Emergency Department (INDEPENDENT_AMBULATORY_CARE_PROVIDER_SITE_OTHER)
Admission: EM | Admit: 2017-03-27 | Discharge: 2017-03-27 | Disposition: A | Discharge status: Home / Self Care | Payer: BLUE CROSS/BLUE SHIELD | Source: Home / Self Care | Attending: Family Medicine | Admitting: Family Medicine

## 2017-03-27 DIAGNOSIS — J439 Emphysema, unspecified: Secondary | ICD-10-CM

## 2017-03-27 DIAGNOSIS — J441 Chronic obstructive pulmonary disease with (acute) exacerbation: Secondary | ICD-10-CM

## 2017-03-27 MED ORDER — ALBUTEROL SULFATE HFA 108 (90 BASE) MCG/ACT IN AERS
1.0000 | INHALATION_SPRAY | Freq: Four times a day (QID) | RESPIRATORY_TRACT | 0 refills | Status: DC | PRN
Start: 2017-03-27 — End: 2022-07-31

## 2017-03-27 MED ORDER — IPRATROPIUM-ALBUTEROL 0.5-2.5 (3) MG/3ML IN SOLN
3.0000 mL | Freq: Once | RESPIRATORY_TRACT | Status: AC
  Administered 2017-03-27: 3 mL via RESPIRATORY_TRACT

## 2017-03-27 MED ORDER — PREDNISONE 20 MG PO TABS: ORAL_TABLET | ORAL | 0 refills | Status: DC

## 2017-03-27 MED ORDER — METHYLPREDNISOLONE SODIUM SUCC 40 MG IJ SOLR
80.0000 mg | Freq: Once | INTRAMUSCULAR | Status: AC
  Administered 2017-03-27: 80 mg via INTRAMUSCULAR

## 2017-03-27 NOTE — Discharge Instructions (Signed)
°  You were given a shot of solumedrol (a steroid) today to help with inflammation in your lungs to help you breath better and to help with your cough.   If you still have prednisone from last visit, you may take your next dose tomorrow and complete the first course.  If still feeling short of breath, you may then start the second week of prescribed oral prednisone.

## 2017-03-27 NOTE — ED Provider Notes (Signed)
[facility]    CSN: [id number] Arrival date & time: 03/27/17  1258     History   Chief Complaint Chief Complaint  Patient presents with  . Shortness of Breath    HPI Willie Thomas is a 61 y.o. male.   HPI Willie Thomas is a 61 y.o. male presenting to UC with c/o worsening SOB despite taking Azithromycin, prednisone and tessalon [facility] for similar symptoms.  He reported last visit that he did not feel inhalers had worked in the past, he declined one during last visit but is requesting one today.  He reports SOB better when outside in the cool/cold.  Denies fever, chills, n/v/d.   Past Medical History:  Diagnosis Date  . Diabetes mellitus without complication (HCC)   . Hyperlipidemia   . Pneumonia     Patient Active Problem List   Diagnosis Date Noted  . Hyperlipidemia     Past Surgical History:  Procedure Laterality Date  . TONSILLECTOMY         Home Medications    Prior to Admission medications   Medication Sig Start Date End Date Taking? Authorizing Provider  albuterol (PROVENTIL HFA;VENTOLIN HFA) 108 (90 Base) MCG/ACT inhaler Inhale 1-2 puffs into the lungs every 6 (six) hours as needed for wheezing or shortness of breath. 03/27/17   Lurene ShadowPhelps, Aniyiah Zell O, PA-C  azithromycin (ZITHROMAX) 250 MG tablet Take 1 tablet (250 mg total) by mouth daily. Take first 2 tablets together, then 1 every day until finished. 03/24/17   Lurene ShadowPhelps, Eldean Klatt O, PA-C  benzonatate (TESSALON) 100 MG capsule Take 1-2 capsules (100-200 mg total) by mouth every 8 (eight) hours. 03/24/17   Lurene ShadowPhelps, Raydon Chappuis O, PA-C  predniSONE (DELTASONE) 20 MG tablet 3 tabs po day one, then 2 po daily x 4 days 03/24/17   Lurene ShadowPhelps, Cohan Stipes O, PA-C  predniSONE (DELTASONE) 20 MG tablet 3 tabs po day one, then 2 po daily x 4 days 03/27/17   Rolla PlatePhelps, Carys Malina O, PA-C    Family History History reviewed. No pertinent family history.  Social History Social History   Tobacco Use  . Smoking status:  Current Every Day Smoker    Packs/day: 1.00    Years: 35.00    Pack years: 35.00    Types: Cigarettes  . Smokeless tobacco: Never Used  Substance Use Topics  . Alcohol use: No  . Drug use: No     Allergies   Patient has no known allergies.   Review of Systems Review of Systems  Constitutional: Negative for chills and fever.  HENT: Positive for congestion ( mild). Negative for ear pain, sore throat, trouble swallowing and voice change.   Respiratory: Positive for cough, chest tightness and shortness of breath.   Cardiovascular: Negative for chest pain and palpitations.  Gastrointestinal: Negative for abdominal pain, diarrhea, nausea and vomiting.  Musculoskeletal: Negative for arthralgias, back pain and myalgias.  Skin: Negative for rash.     Physical Exam Triage Vital Signs ED Triage Vitals  Enc Vitals Group     BP 03/27/17 1329 130/72     Pulse Rate 03/27/17 1329 92     Resp --      Temp 03/27/17 1329 97.8 F (36.6 C)     Temp Source 03/27/17 1329 Oral     SpO2 03/27/17 1329 94 %     Weight --      Height --      Head Circumference --  Peak Flow --      Pain Score 03/27/17 1330 0     Pain Loc --      Pain Edu? --      Excl. in GC? --    No data found.  Updated Vital Signs BP 130/72 (BP Location: Right Arm)   Pulse 92   Temp 97.8 F (36.6 C) (Oral)   SpO2 94%     Physical Exam  Constitutional: He is oriented to person, place, and time. He appears well-developed and well-nourished.  Non-toxic appearance. He does not appear ill. No distress.  HENT:  Head: Normocephalic and atraumatic.  Right Ear: Tympanic membrane normal.  Left Ear: Tympanic membrane normal.  Nose: Mucosal edema present. Right sinus exhibits no maxillary sinus tenderness and no frontal sinus tenderness. Left sinus exhibits no maxillary sinus tenderness and no frontal sinus tenderness.  Mouth/Throat: Uvula is midline, oropharynx is clear and moist and mucous membranes are normal.    Eyes: EOM are normal.  Neck: Normal range of motion.  Cardiovascular: Normal rate and regular rhythm.  Pulmonary/Chest: Effort normal. He has decreased breath sounds in the right lower field and the left lower field. He has wheezes in the right lower field. He has no rhonchi. He has no rales.  Musculoskeletal: Normal range of motion.  Neurological: He is alert and oriented to person, place, and time.  Skin: Skin is warm and dry.  Psychiatric: He has a normal mood and affect. His behavior is normal.  Nursing note and vitals reviewed.    UC Treatments / Results  Labs (all labs ordered are listed, but only abnormal results are displayed) Labs Reviewed - No data to display  EKG  EKG Interpretation None       Radiology Dg Chest 2 View  Result Date: 03/27/2017 CLINICAL DATA:  Chest tightness and shortness of breath. EXAM: CHEST  2 VIEW COMPARISON:  12/24/2015 FINDINGS: Large lung volumes with asymmetric lucency at the right apex, emphysema as confirmed on chest CT report 06/12/2013-images not available. There is no edema, consolidation, effusion, or pneumothorax. Normal heart size and mediastinal contours. No acute osseous finding. IMPRESSION: 1. No acute finding. 2. Emphysema. Electronically Signed   By: Marnee SpringJonathon  Watts M.D.   On: 03/27/2017 14:11    Procedures Procedures (including critical care time)  Medications Ordered in UC Medications  methylPREDNISolone sodium succinate (SOLU-MEDROL) 40 mg/mL injection 80 mg (80 mg Intramuscular Given 03/27/17 1343)  ipratropium-albuterol (DUONEB) 0.5-2.5 (3) MG/3ML nebulizer solution 3 mL (3 mLs Nebulization Given 03/27/17 1344)     Initial Impression / Assessment and Plan / UC Course  I have reviewed the triage vital signs and the nursing notes.  Pertinent labs & imaging results that were available during my care of the patient were reviewed by me and considered in my medical decision making (see chart for details).     Pt c/o  worsening SOB and chest tightness.  O2 Sat in triage improved from last visit on 03/24/17.  Today it is 94%, then it was 92% Wheeze in RLL field.  Duoneb and Solumedrol given CXR: no acute findings, emphysema seen- pt is aware he has COPD F/u with PCP next week for recheck of symptoms and better management of COPD Discussed symptoms that warrant emergent care in the ED.   Final Clinical Impressions(s) / UC Diagnoses   Final diagnoses:  COPD exacerbation [facility](HCC)    ED Discharge Orders        Ordered    predniSONE (DELTASONE)  20 MG tablet     03/27/17 1423    albuterol (PROVENTIL HFA;VENTOLIN HFA) 108 (90 Base) MCG/ACT inhaler  Every 6 hours PRN     03/27/17 1423       Controlled Substance Prescriptions [place] Controlled Substance Registry consulted? Not Applicable   Rolla Plate 03/27/17 1429

## 2017-03-27 NOTE — ED Triage Notes (Signed)
Pt c/o SOB that has worsened since his last visit. He is taking meds as prescribed. Denies fever.

## 2017-03-29 ENCOUNTER — Telehealth: Payer: Self-pay | Admitting: *Deleted

## 2017-03-29 NOTE — Telephone Encounter (Signed)
Callback: No answer. LMOM f/u from visit. Call back as needed. Reminded to f/u with PCP next week.

## 2017-05-17 IMAGING — DX DG LUMBAR SPINE COMPLETE 4+V
5 series · 5 of 5 positions shown · non-contrast
Comparison: None.

CLINICAL DATA: Right hip pain

EXAM:
LUMBAR SPINE - COMPLETE 4+ VIEW

[l-spine ap]
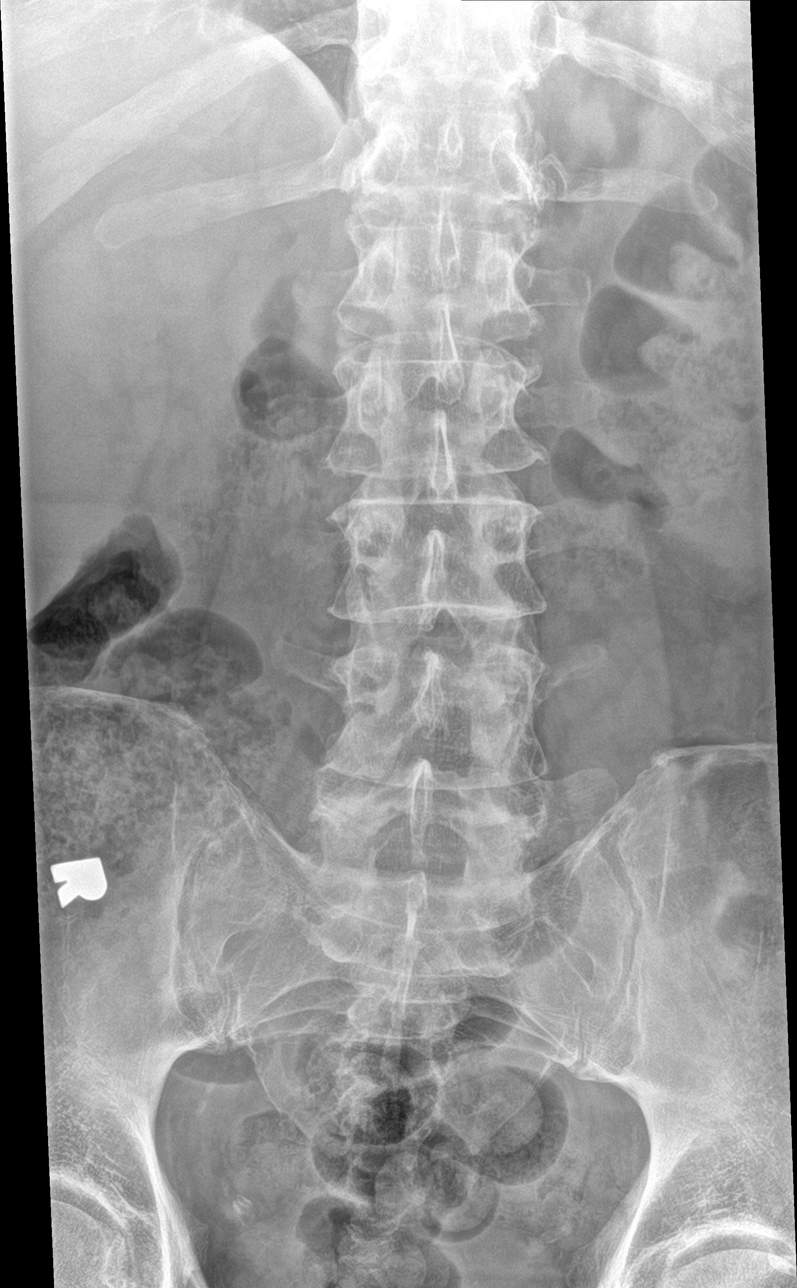

[l-spine obl (1 of 2)]
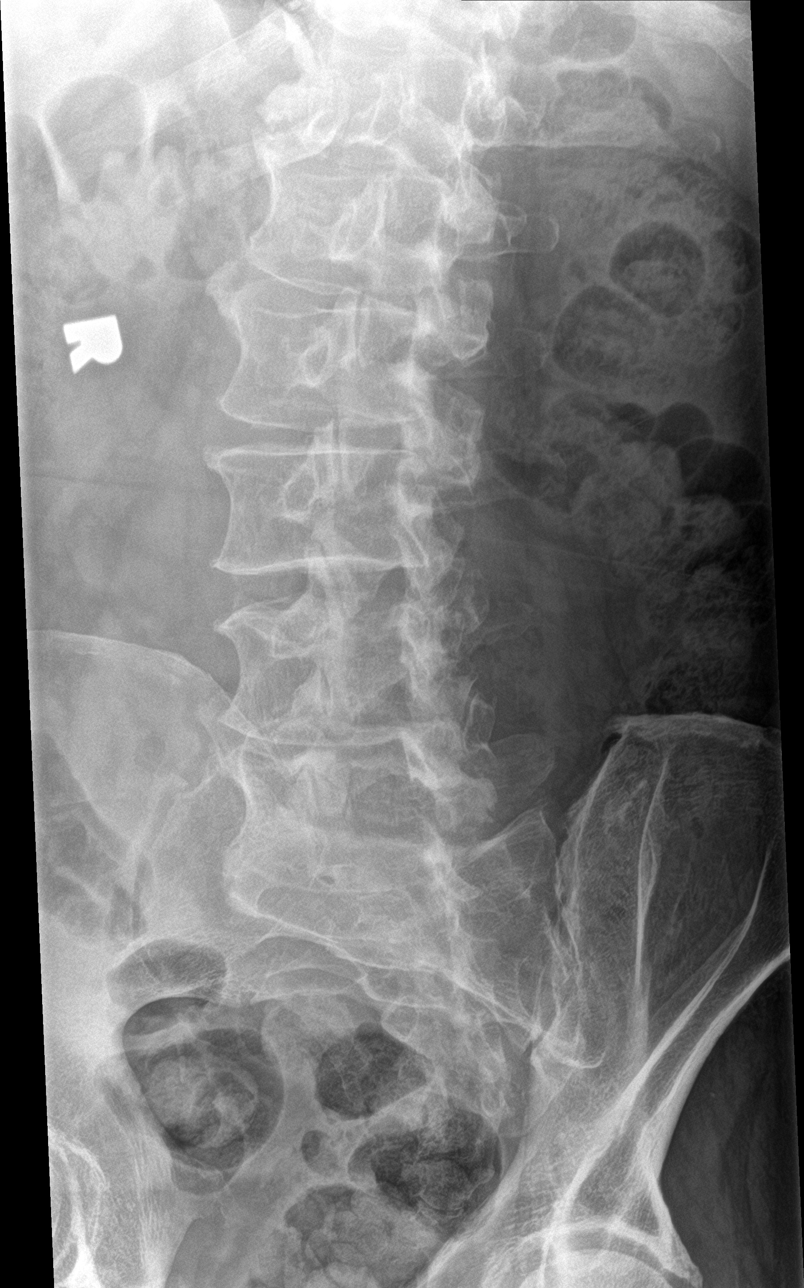

[l-spine obl (2 of 2)]
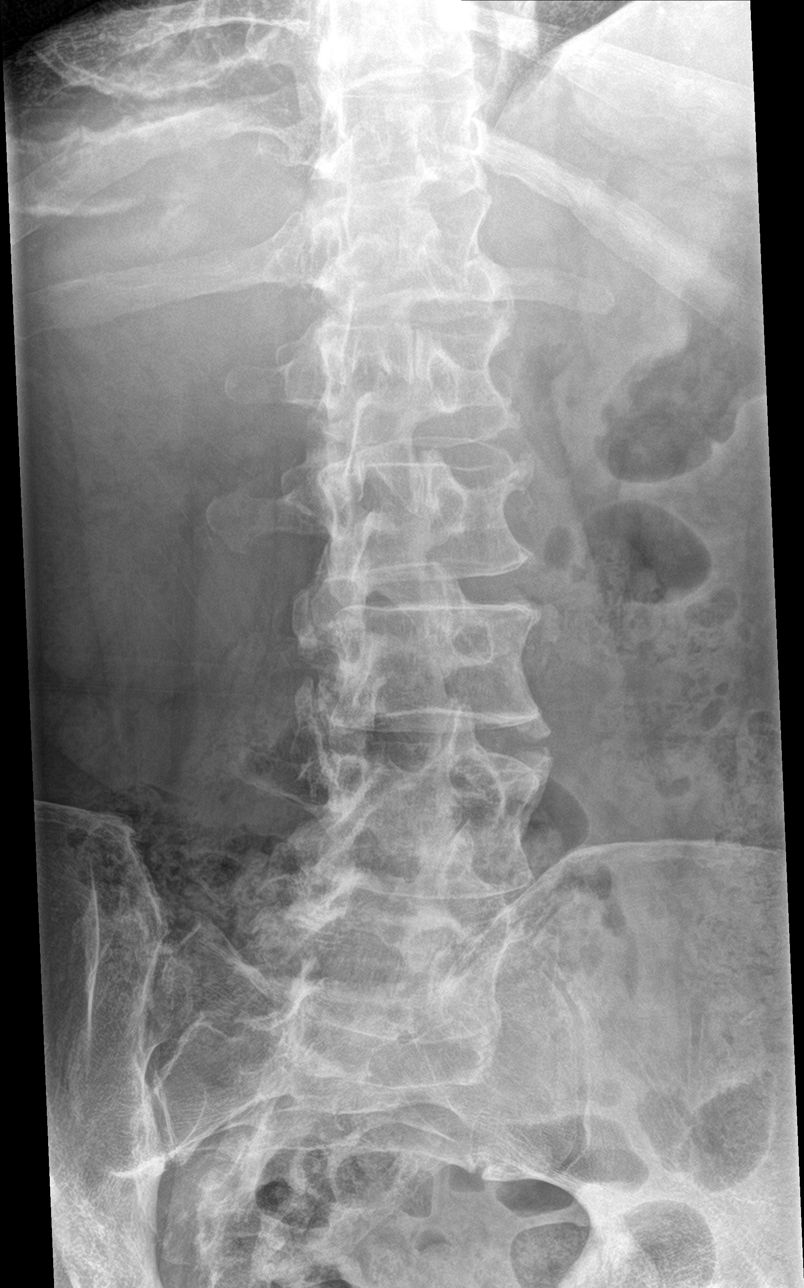

[l-spine lat]
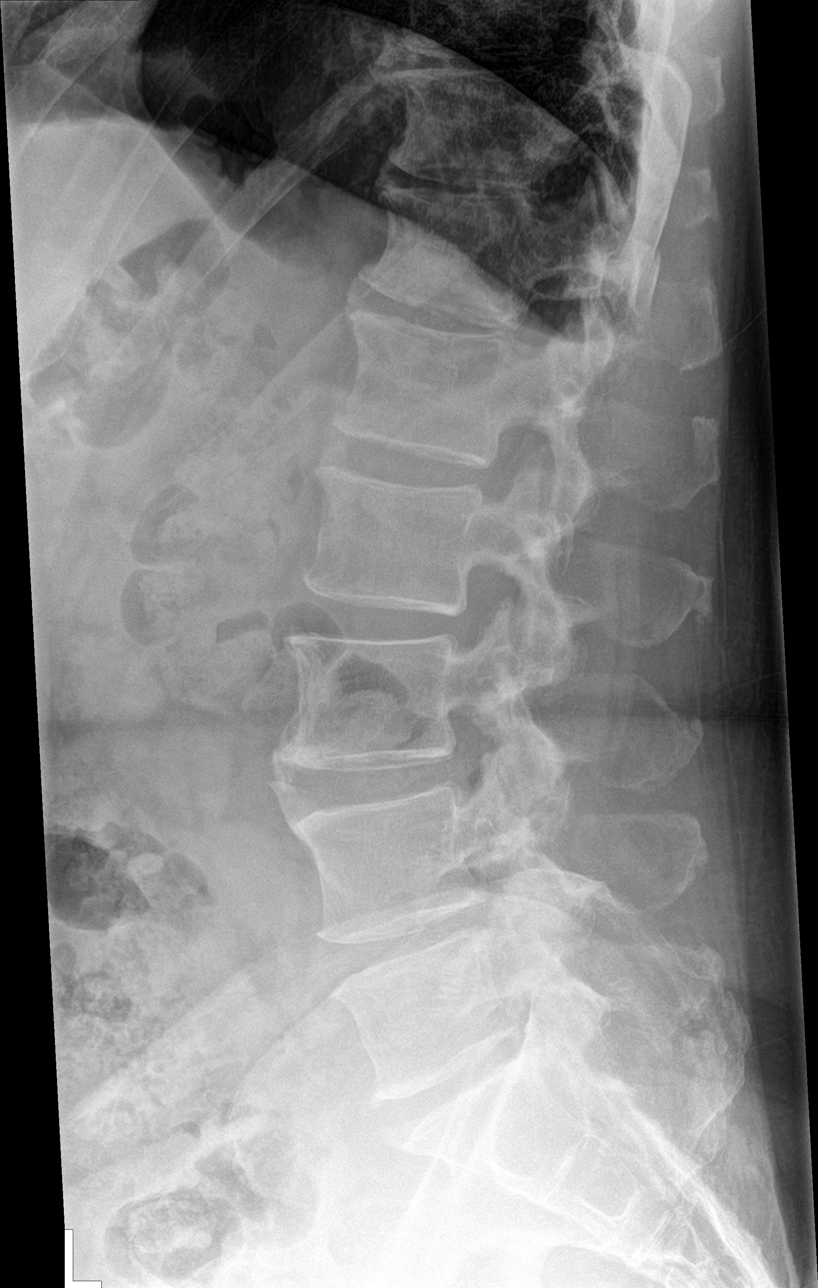

[l-spine spot]
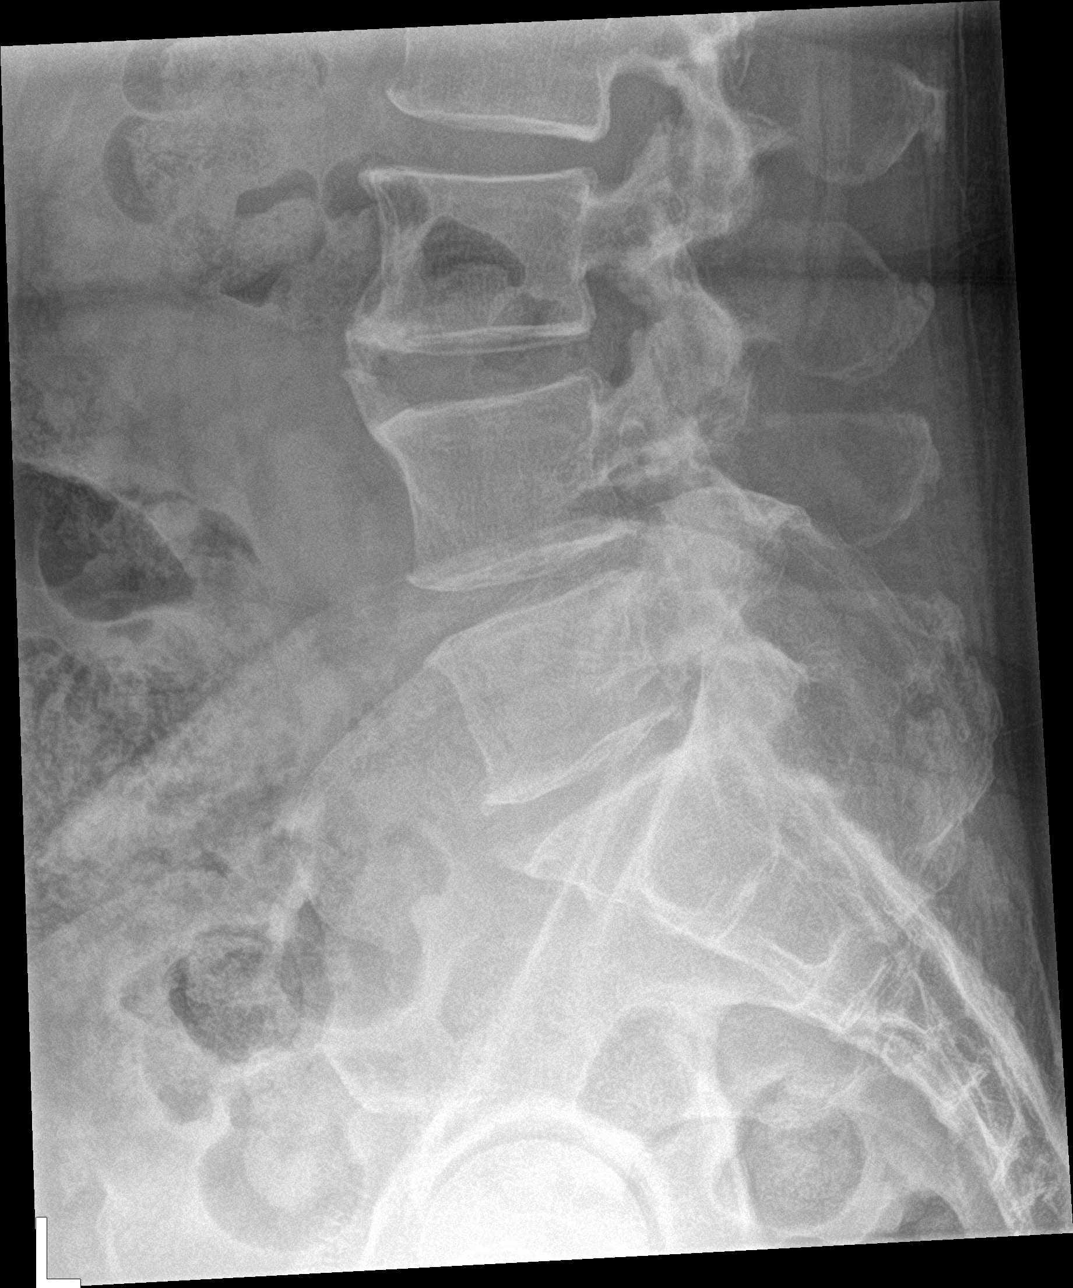

[5 of 5 positions shown; findings below may reference images not displayed]

FINDINGS: Normal alignment. No fracture or pars defect. Mild disc degeneration
in the lower lumbar spine. Mild spurring at L4-5 and L5-S1.
IMPRESSION: Multilevel lumbar degenerative change.  No acute abnormality.

## 2018-11-30 ENCOUNTER — Other Ambulatory Visit: Payer: Self-pay

## 2018-11-30 ENCOUNTER — Encounter: Payer: Self-pay | Admitting: Emergency Medicine

## 2018-11-30 ENCOUNTER — Emergency Department
Admission: EM | Admit: 2018-11-30 | Discharge: 2018-11-30 | Disposition: A | Discharge status: Home / Self Care | Payer: BLUE CROSS/BLUE SHIELD | Source: Home / Self Care

## 2018-11-30 DIAGNOSIS — H60531 Acute contact otitis externa, right ear: Secondary | ICD-10-CM | POA: Diagnosis not present

## 2018-11-30 DIAGNOSIS — L03211 Cellulitis of face: Secondary | ICD-10-CM

## 2018-11-30 MED ORDER — AMOXICILLIN-POT CLAVULANATE 875-125 MG PO TABS: 1.0000 | ORAL_TABLET | Freq: Two times a day (BID) | ORAL | 0 refills | Status: DC

## 2018-11-30 MED ORDER — NEOMYCIN-POLYMYXIN-HC 3.5-10000-1 OT SUSP: 4.0000 [drp] | Freq: Four times a day (QID) | OTIC | 0 refills | Status: AC

## 2018-11-30 NOTE — ED Provider Notes (Signed)
[facility]    CSN: [id number] Arrival date & time: 11/30/18  1437     History   Chief Complaint Chief Complaint  Patient presents with  . Otalgia    HPI Willie Thomas is a 63 y.o. male.   HPI Willie Thomas is a 63 y.o. male presenting to UC with c/o gradually worsening Left ear pain that started about 2-3 weeks ago with a sore throat. Pain was waxing and waning but is now constant.  He reports getting a splash of chemical from work on the side of his face a few weeks ago and wonders if that could be contributing to the pain.  He rinsed his face after that occurred. Denies fever, chills, n/v/d. No cough or congestion. Sore throat has since resolved.   Past Medical History:  Diagnosis Date  . Diabetes mellitus without complication (HCC)   . Hyperlipidemia   . Pneumonia     Patient Active Problem List   Diagnosis Date Noted  . Hyperlipidemia     Past Surgical History:  Procedure Laterality Date  . TONSILLECTOMY         Home Medications    Prior to Admission medications   Medication Sig Start Date End Date Taking? Authorizing Provider  ANORO ELLIPTA 62.5-25 MCG/INH AEPB TAKE 1 PUFF BY MOUTH EVERY DAY 11/07/18  Yes [provider]  atorvastatin (LIPITOR) 20 MG tablet Take 20 mg by mouth daily. 09/30/18  Yes [provider]  glipiZIDE-metformin (METAGLIP) 5-500 MG tablet TAKE 1 TABLET BY MOUTH TWICE A DAY BEFORE MEALS 07/03/18  Yes [provider]  albuterol (PROVENTIL HFA;VENTOLIN HFA) 108 (90 Base) MCG/ACT inhaler Inhale 1-2 puffs into the lungs every 6 (six) hours as needed for wheezing or shortness of breath. 03/27/17   Lurene ShadowPhelps, Ashima Shrake O, PA-C  amoxicillin-clavulanate (AUGMENTIN) 875-125 MG tablet Take 1 tablet by mouth 2 (two) times daily. One po bid x 7 days 11/30/18   Lurene ShadowPhelps, Jeily Guthridge O, PA-C  azithromycin (ZITHROMAX) 250 MG tablet Take 1 tablet (250 mg total) by mouth daily. Take first 2 tablets together, then 1 every day until  finished. 03/24/17   Lurene ShadowPhelps, Yael Coppess O, PA-C  benzonatate (TESSALON) 100 MG capsule Take 1-2 capsules (100-200 mg total) by mouth every 8 (eight) hours. 03/24/17   Lurene ShadowPhelps, Dilyn Osoria O, PA-C  neomycin-polymyxin-hydrocortisone (CORTISPORIN) 3.5-10000-1 OTIC suspension Place 4 drops into the left ear 4 (four) times daily for 7 days. X 7 days 11/30/18 12/07/18  Lurene ShadowPhelps, Amen Staszak O, PA-C  predniSONE (DELTASONE) 20 MG tablet 3 tabs po day one, then 2 po daily x 4 days 03/24/17   Lurene ShadowPhelps, Shep Porter O, PA-C  predniSONE (DELTASONE) 20 MG tablet 3 tabs po day one, then 2 po daily x 4 days 03/27/17   Lurene ShadowPhelps, Adalyn Pennock O, PA-C  glipiZIDE (GLUCOTROL XL) 5 MG 24 hr tablet Take 5 mg by mouth daily with breakfast.  06/06/14  [provider]    Family History No family history on file.  Social History Social History   Tobacco Use  . Smoking status: Current Every Day Smoker    Packs/day: 1.00    Years: 35.00    Pack years: 35.00    Types: Cigarettes  . Smokeless tobacco: Never Used  Substance Use Topics  . Alcohol use: No  . Drug use: No     Allergies   Patient has no known allergies.   Review of Systems Review of Systems  Constitutional: Negative for chills and fever.  HENT: Positive for  ear pain (Left). Negative for congestion, sore throat, trouble swallowing and voice change.   Respiratory: Negative for cough and shortness of breath.   Cardiovascular: Negative for chest pain and palpitations.  Gastrointestinal: Negative for abdominal pain, diarrhea, nausea and vomiting.  Musculoskeletal: Negative for arthralgias, back pain and myalgias.  Skin: Negative for rash.     Physical Exam Triage Vital Signs ED Triage Vitals [11/30/18 1509]  Enc Vitals Group     BP 116/73     Pulse Rate (!) 108     Resp      Temp 98.2 F (36.8 C)     Temp Source Oral     SpO2 96 %     Weight 256 lb (116.1 kg)     Height 6\' 2"  (1.88 m)     Head Circumference      Peak Flow      Pain Score 3     Pain Loc      Pain Edu?       Excl. in [place]?    No data found.  Updated Vital Signs BP 116/73 (BP Location: Left Arm)   Pulse (!) 103   Temp 98.2 F (36.8 C) (Oral)   Ht 6\' 2"  (1.88 m)   Wt 256 lb (116.1 kg)   SpO2 96%   BMI 32.87 kg/m   Visual Acuity Right Eye Distance:   Left Eye Distance:   Bilateral Distance:    Right Eye Near:   Left Eye Near:    Bilateral Near:     Physical Exam Vitals signs and nursing note reviewed.  Constitutional:      Appearance: Normal appearance. He is well-developed.  HENT:     Head: Normocephalic and atraumatic.      Comments: Left side of face around ear: mild edema, erythema, tenderness. No induration or fluctuance.    Right Ear: Tympanic membrane normal.     Left Ear: Tympanic membrane normal. Drainage (scant, yellow), swelling and tenderness present. No mastoid tenderness. Tympanic membrane is not perforated, erythematous or bulging.     Nose: Nose normal.     Right Sinus: No maxillary sinus tenderness or frontal sinus tenderness.     Left Sinus: No maxillary sinus tenderness or frontal sinus tenderness.     Mouth/Throat:     Lips: Pink.     Mouth: Mucous membranes are moist.     Pharynx: Oropharynx is clear. Uvula midline.  Neck:     Musculoskeletal: Normal range of motion.  Cardiovascular:     Rate and Rhythm: Regular rhythm. Tachycardia present.     Comments: Mild tachycardia Pulmonary:     Effort: Pulmonary effort is normal. No respiratory distress.     Breath sounds: Normal breath sounds. No stridor. No wheezing, rhonchi or rales.  Musculoskeletal: Normal range of motion.  Skin:    General: Skin is warm and dry.     Findings: Erythema ( left side of face) present.  Neurological:     Mental Status: He is alert and oriented to person, place, and time.  Psychiatric:        Behavior: Behavior normal.      UC Treatments / Results  Labs (all labs ordered are listed, but only abnormal results are displayed) Labs Reviewed - No data to display  EKG    Radiology No results found.  Procedures Procedures (including critical care time)  Medications Ordered in UC Medications - No data to display  Initial Impression / Assessment and Plan /  UC Course  I have reviewed the triage vital signs and the nursing notes.  Pertinent labs & imaging results that were available during my care of the patient were reviewed by me and considered in my medical decision making (see chart for details).     Exam c/w otitis externa and cellulitis Will tx with topical and oral antibiotics AVS provided.  Final Clinical Impressions(s) / UC Diagnoses   Final diagnoses:  Acute contact otitis externa of right ear  Facial cellulitis     Discharge Instructions      Please take antibiotics as prescribed and be sure to complete entire course even if you start to feel better to ensure infection does not come back.  Please follow up with family medicine in 1 week if not improving, sooner if worsening.    ED Prescriptions    Medication Sig Dispense Auth. Provider   amoxicillin-clavulanate (AUGMENTIN) 875-125 MG tablet Take 1 tablet by mouth 2 (two) times daily. One po bid x 7 days 14 tablet Mirelle Biskup O, PA-C   neomycin-polymyxin-hydrocortisone (CORTISPORIN) 3.5-10000-1 OTIC suspension Place 4 drops into the left ear 4 (four) times daily for 7 days. X 7 days 5.6 mL Noe Gens, PA-C     Controlled Substance Prescriptions [place] Controlled Substance Registry consulted? Not Applicable   Tyrell Antonio 11/30/18 1556

## 2018-11-30 NOTE — ED Triage Notes (Signed)
Patient c/o left ear pain for several weeks, worsen over the last couple of days.  No other sxs

## 2018-11-30 NOTE — Discharge Instructions (Signed)
°  Please take antibiotics as prescribed and be sure to complete entire course even if you start to feel better to ensure infection does not come back. ° °Please follow up with family medicine in 1 week if not improving, sooner if worsening.  °

## 2019-06-10 ENCOUNTER — Emergency Department (INDEPENDENT_AMBULATORY_CARE_PROVIDER_SITE_OTHER): Payer: Managed Care, Other (non HMO)

## 2019-06-10 ENCOUNTER — Emergency Department
Admission: EM | Admit: 2019-06-10 | Discharge: 2019-06-10 | Disposition: A | Discharge status: Home / Self Care | Payer: Managed Care, Other (non HMO) | Source: Home / Self Care | Attending: Family Medicine | Admitting: Family Medicine

## 2019-06-10 ENCOUNTER — Encounter: Payer: Self-pay | Admitting: Emergency Medicine

## 2019-06-10 ENCOUNTER — Other Ambulatory Visit: Payer: Self-pay

## 2019-06-10 DIAGNOSIS — J441 Chronic obstructive pulmonary disease with (acute) exacerbation: Secondary | ICD-10-CM | POA: Diagnosis not present

## 2019-06-10 DIAGNOSIS — R0609 Other forms of dyspnea: Secondary | ICD-10-CM | POA: Diagnosis not present

## 2019-06-10 HISTORY — DX: Chronic obstructive pulmonary disease, unspecified: J44.9

## 2019-06-10 MED ORDER — PREDNISONE 20 MG PO TABS: ORAL_TABLET | ORAL | 0 refills | Status: DC

## 2019-06-10 NOTE — Discharge Instructions (Addendum)
Continue Anoro inhaler. Continue albuterol inhaler as needed. Recommend obtaining a pulse oximeter (oxygen meter) and check oxygen several times weekly. If symptoms become significantly worse during the night or over the weekend, proceed to the local emergency room.

## 2019-06-10 NOTE — ED Provider Notes (Signed)
[facility]    CSN: [id number] Arrival date & time: 06/10/19  1458      History   Chief Complaint Chief Complaint  Patient presents with  . Shortness of Breath    HPI Willie Thomas is a 64 y.o. male.   Patient has COPD.  During the past month he has had increased shortness of breath with activity, but denies cough, wheezing, fever.  He denies chest pain, lower leg edema, and PND.  He reports that he has not had much relief from an albuterol MDI that was prescribed during a televisit 6 days ago.  He presently uses an Anoro inhaler once daily.  He denies chest tightness and changes in taste/smell.    The history is provided by the patient.    Past Medical History:  Diagnosis Date  . COPD (chronic obstructive pulmonary disease) (HCC)   . Diabetes mellitus without complication (HCC)   . Hyperlipidemia   . Pneumonia     Patient Active Problem List   Diagnosis Date Noted  . Hyperlipidemia     Past Surgical History:  Procedure Laterality Date  . TONSILLECTOMY         Home Medications    Prior to Admission medications   Medication Sig Start Date End Date Taking? Authorizing Provider  albuterol (PROVENTIL HFA;VENTOLIN HFA) 108 (90 Base) MCG/ACT inhaler Inhale 1-2 puffs into the lungs every 6 (six) hours as needed for wheezing or shortness of breath. 03/27/17   Phelps, Erin O, PA-C  ANORO ELLIPTA 62.5-25 MCG/INH AEPB TAKE 1 PUFF BY MOUTH EVERY DAY 11/07/18   [provider]  atorvastatin (LIPITOR) 20 MG tablet Take 20 mg by mouth daily. 09/30/18   [provider]  benzonatate (TESSALON) 100 MG capsule Take 1-2 capsules (100-200 mg total) by mouth every 8 (eight) hours. 03/24/17   Lurene Shadow, PA-C  glipiZIDE-metformin (METAGLIP) 5-500 MG tablet TAKE 1 TABLET BY MOUTH TWICE A DAY BEFORE MEALS 07/03/18   [provider]  predniSONE (DELTASONE) 20 MG tablet Take one tab by mouth twice daily for 4 days, then one daily. Take with food.  06/10/19   Lattie Haw, MD  glipiZIDE (GLUCOTROL XL) 5 MG 24 hr tablet Take 5 mg by mouth daily with breakfast.  06/06/14  [provider]    Family History Not recorded by patient  Social History Social History   Tobacco Use  . Smoking status: Current Every Day Smoker    Packs/day: 1.00    Years: 35.00    Pack years: 35.00    Types: Cigarettes  . Smokeless tobacco: Never Used  Substance Use Topics  . Alcohol use: No  . Drug use: No     Allergies   Patient has no known allergies.   Review of Systems Review of Systems No sore throat No cough No pleuritic pain No wheezing ? nasal congestion No post-nasal drainage No sinus pain/pressure No itchy/red eyes No earache No hemoptysis + SOB with activity No fever/chills No nausea No vomiting No abdominal pain No diarrhea No urinary symptoms No skin rash + fatigue No myalgias No headache    Physical Exam Triage Vital Signs ED Triage Vitals  Enc Vitals Group     BP 06/10/19 1525 125/81     Pulse Rate 06/10/19 1525 90     Resp 06/10/19 1525 18     Temp 06/10/19 1525 98.2 F (36.8 C)     Temp Source 06/10/19 1525 Oral  SpO2 06/10/19 1525 98 %     Weight 06/10/19 1526 245 lb (111.1 kg)     Height 06/10/19 1526 6\' 2"  (1.88 m)     Head Circumference --      Peak Flow --      Pain Score 06/10/19 1526 0     Pain Loc --      Pain Edu? --      Excl. in [place]? --    No data found.  Updated Vital Signs BP 125/81 (BP Location: Right Arm)   Pulse 90   Temp 98.2 F (36.8 C) (Oral)   Resp 18   Ht 6\' 2"  (1.88 m)   Wt 111.1 kg   SpO2 98%   BMI 31.46 kg/m   Visual Acuity Right Eye Distance:   Left Eye Distance:   Bilateral Distance:    Right Eye Near:   Left Eye Near:    Bilateral Near:     Physical Exam Nursing notes and Vital Signs reviewed. Appearance:  Patient appears stated age, and in no acute distress Eyes:  Pupils are equal, round, and reactive to light and accomodation.   Extraocular movement is intact.  Conjunctivae are not inflamed  Ears:  Canals normal.  Tympanic membranes normal.  Nose:  Mildly congested turbinates.  No sinus tenderness.   Pharynx:  Normal Neck:  Supple.  No adenopathy. Lungs:  Breath sounds are distant.  Breath sounds are equal.  Heart:  Regular rate and rhythm without murmurs, rubs, or gallops.  Abdomen:  Nontender without masses or hepatosplenomegaly.  Bowel sounds are present.  No CVA or flank tenderness.  Extremities:  No edema.  Skin:  No rash present.   UC Treatments / Results  Labs (all labs ordered are listed, but only abnormal results are displayed) Labs Reviewed - No data to display  EKG   Radiology DG Chest 2 View  Result Date: 06/10/2019 CLINICAL DATA:  Dyspnea on exertion EXAM: CHEST - 2 VIEW COMPARISON:  03/27/2017 FINDINGS: Pulmonary hyperinflation without focal airspace disease, pleural effusion or pneumothorax. Normal heart size. IMPRESSION: No active cardiopulmonary disease.  Hyperinflated lungs Electronically Signed   By: Donavan Foil M.D.   On: 06/10/2019 16:03    Procedures Procedures (including critical care time)  Medications Ordered in UC Medications - No data to display  Initial Impression / Assessment and Plan / UC Course  I have reviewed the triage vital signs and the nursing notes.  Pertinent labs & imaging results that were available during my care of the patient were reviewed by me and considered in my medical decision making (see chart for details).    No changes on chest x-ray. Begin prednisone burst/taper.  Followup with pulmonologist if not improving or if symptoms become worse.   Final Clinical Impressions(s) / UC Diagnoses   Final diagnoses:  COPD exacerbation [facility])     Discharge Instructions     Continue Anoro inhaler. Continue albuterol inhaler as needed. Recommend obtaining a pulse oximeter (oxygen meter) and check oxygen several times weekly. If symptoms become  significantly worse during the night or over the weekend, proceed to the local emergency room.     ED Prescriptions    Medication Sig Dispense Auth. Provider   predniSONE (DELTASONE) 20 MG tablet Take one tab by mouth twice daily for 4 days, then one daily. Take with food. 12 tablet Kandra Nicolas, MD        Kandra Nicolas, MD 06/10/19 [id number]

## 2019-06-10 NOTE — ED Triage Notes (Signed)
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 He does not consistently use masks for his industrial needs nor for PPE. No fever; no known exposure to covid positive person. Did tele visit with his PCP 6 days ago and got albuterol inhaler rx which has not helped. Has had influenza vacc this season.

## 2019-10-04 ENCOUNTER — Encounter: Payer: Self-pay | Admitting: Emergency Medicine

## 2019-10-04 ENCOUNTER — Other Ambulatory Visit: Payer: Self-pay

## 2019-10-04 ENCOUNTER — Emergency Department
Admission: EM | Admit: 2019-10-04 | Discharge: 2019-10-04 | Disposition: A | Discharge status: Home / Self Care | Payer: Managed Care, Other (non HMO) | Source: Home / Self Care

## 2019-10-04 DIAGNOSIS — M545 Low back pain, unspecified: Secondary | ICD-10-CM

## 2019-10-04 MED ORDER — METHOCARBAMOL 500 MG PO TABS: 500.0000 mg | ORAL_TABLET | Freq: Two times a day (BID) | ORAL | 0 refills | Status: DC

## 2019-10-04 MED ORDER — METHYLPREDNISOLONE ACETATE 80 MG/ML IJ SUSP
80.0000 mg | Freq: Once | INTRAMUSCULAR | Status: AC
  Administered 2019-10-04: 80 mg via INTRAMUSCULAR

## 2019-10-04 MED ORDER — TRAMADOL HCL 50 MG PO TABS: 50.0000 mg | ORAL_TABLET | Freq: Four times a day (QID) | ORAL | 0 refills | Status: DC | PRN

## 2019-10-04 NOTE — ED Provider Notes (Signed)
[facility]    CSN: [id number] Arrival date & time: 10/04/19  1425      History   Chief Complaint Chief Complaint  Patient presents with  . Back Pain    HPI Willie Thomas is a 64 y.o. male.   HPI Willie Thomas is a 64 y.o. male presenting to UC with c/o bilateral low back pain that started 3 days ago after moving heavy equipment at work. No specific injury. He has taken OTC pain medication and used heat with mild relief. Denies radiation of pain or numbness in arms or legs. No change in bowel or bladder habits.    Past Medical History:  Diagnosis Date  . COPD (chronic obstructive pulmonary disease) (HCC)   . Diabetes mellitus without complication (HCC)   . Hyperlipidemia   . Pneumonia     Patient Active Problem List   Diagnosis Date Noted  . Hyperlipidemia     Past Surgical History:  Procedure Laterality Date  . TONSILLECTOMY         Home Medications    Prior to Admission medications   Medication Sig Start Date End Date Taking? Authorizing Provider  albuterol (PROVENTIL HFA;VENTOLIN HFA) 108 (90 Base) MCG/ACT inhaler Inhale 1-2 puffs into the lungs every 6 (six) hours as needed for wheezing or shortness of breath. 03/27/17  Yes Alexzandra Bilton O, PA-C  ANORO ELLIPTA 62.5-25 MCG/INH AEPB TAKE 1 PUFF BY MOUTH EVERY DAY 11/07/18  Yes [provider]  atorvastatin (LIPITOR) 20 MG tablet Take 20 mg by mouth daily. 09/30/18  Yes [provider]  glipiZIDE-metformin (METAGLIP) 5-500 MG tablet TAKE 1 TABLET BY MOUTH TWICE A DAY BEFORE MEALS 07/03/18  Yes [provider]  benzonatate (TESSALON) 100 MG capsule Take 1-2 capsules (100-200 mg total) by mouth every 8 (eight) hours. 03/24/17   Lurene Shadow, PA-C  methocarbamol (ROBAXIN) 500 MG tablet Take 1 tablet (500 mg total) by mouth 2 (two) times daily. 10/04/19   Lurene Shadow, PA-C  predniSONE (DELTASONE) 20 MG tablet Take one tab by mouth twice daily for 4 days, then one daily. Take  with food. 06/10/19   Lattie Haw, MD  traMADol (ULTRAM) 50 MG tablet Take 1 tablet (50 mg total) by mouth every 6 (six) hours as needed. 10/04/19   Lurene Shadow, PA-C  glipiZIDE (GLUCOTROL XL) 5 MG 24 hr tablet Take 5 mg by mouth daily with breakfast.  06/06/14  [provider]    Family History No family history on file.  Social History Social History   Tobacco Use  . Smoking status: Current Every Day Smoker    Packs/day: 1.00    Years: 35.00    Pack years: 35.00    Types: Cigarettes  . Smokeless tobacco: Never Used  Substance Use Topics  . Alcohol use: No  . Drug use: No     Allergies   Patient has no known allergies.   Review of Systems Review of Systems  Musculoskeletal: Positive for back pain and myalgias. Negative for arthralgias and joint swelling.  Neurological: Negative for weakness and numbness.     Physical Exam Triage Vital Signs ED Triage Vitals [10/04/19 1505]  Enc Vitals Group     BP 113/69     Pulse Rate 89     Resp      Temp      Temp src      SpO2 97 %     Weight  Height      Head Circumference      Peak Flow      Pain Score 7     Pain Loc      Pain Edu?      Excl. in [place]?    No data found.  Updated Vital Signs BP 113/69 (BP Location: Left Arm)   Pulse 89   SpO2 97%   Visual Acuity Right Eye Distance:   Left Eye Distance:   Bilateral Distance:    Right Eye Near:   Left Eye Near:    Bilateral Near:     Physical Exam Vitals and nursing note reviewed.  Constitutional:      Appearance: Normal appearance. He is well-developed.  HENT:     Head: Normocephalic and atraumatic.  Cardiovascular:     Rate and Rhythm: Normal rate.  Pulmonary:     Effort: Pulmonary effort is normal.  Musculoskeletal:        General: Tenderness present. Normal range of motion.     Cervical back: Normal range of motion.     Comments: Tenderness across lower back muscles. No localized bony tenderness Negative straight leg raise.     Skin:    General: Skin is warm and dry.     Findings: No bruising or erythema.  Neurological:     Mental Status: He is alert and oriented to person, place, and time.  Psychiatric:        Behavior: Behavior normal.      UC Treatments / Results  Labs (all labs ordered are listed, but only abnormal results are displayed) Labs Reviewed - No data to display  EKG   Radiology No results found.  Procedures Procedures (including critical care time)  Medications Ordered in UC Medications  methylPREDNISolone acetate (DEPO-MEDROL) injection 80 mg (80 mg Intramuscular Given 10/04/19 1531)    Initial Impression / Assessment and Plan / UC Course  I have reviewed the triage vital signs and the nursing notes.  Pertinent labs & imaging results that were available during my care of the patient were reviewed by me and considered in my medical decision making (see chart for details).    Hx and exam c/w muscle strain No red flag symptoms, no indication for urgent/emergent imaging at this time. Will tx symptomatically F/u with Sports Medicine AVS given  Final Clinical Impressions(s) / UC Diagnoses   Final diagnoses:  Acute bilateral low back pain without sciatica     Discharge Instructions      You may take 500mg  acetaminophen every 4-6 hours or in combination with ibuprofen 400-600mg  every 6-8 hours as needed for pain and inflammation.  Tramadol is strong pain medication. While taking, do not drink alcohol, drive, or perform any other activities that requires focus while taking these medications.   Robaxin (methocarbamol) is a muscle relaxer and may cause drowsiness. Do not drink alcohol, drive, or operate heavy machinery while taking.  Please call to schedule a follow up appointment with Sports Medicine later this week if not improving.       ED Prescriptions    Medication Sig Dispense Auth. Provider   traMADol (ULTRAM) 50 MG tablet Take 1 tablet (50 mg total) by mouth  every 6 (six) hours as needed. 15 tablet , Anissa Abbs O, PA-C   methocarbamol (ROBAXIN) 500 MG tablet Take 1 tablet (500 mg total) by mouth 2 (two) times daily. 20 tablet Doroteo Glassman, Lurene Shadow     I have reviewed the PDMP during this encounter.  Noe Gens, [place] 10/05/19 [phone]

## 2019-10-04 NOTE — ED Triage Notes (Signed)
Patient c/o low back pain since Wednesday, no apparent injury, he does move heavy equipment at work.  Patient has been taking Ibuprofen for the pain.

## 2019-10-04 NOTE — Discharge Instructions (Signed)
  You may take 500mg  acetaminophen every 4-6 hours or in combination with ibuprofen 400-600mg  every 6-8 hours as needed for pain and inflammation.  Tramadol is strong pain medication. While taking, do not drink alcohol, drive, or perform any other activities that requires focus while taking these medications.   Robaxin (methocarbamol) is a muscle relaxer and may cause drowsiness. Do not drink alcohol, drive, or operate heavy machinery while taking.  Please call to schedule a follow up appointment with Sports Medicine later this week if not improving.

## 2020-08-11 ENCOUNTER — Other Ambulatory Visit: Payer: Self-pay

## 2020-08-11 ENCOUNTER — Emergency Department
Admission: EM | Admit: 2020-08-11 | Discharge: 2020-08-11 | Disposition: A | Discharge status: Home / Self Care | Payer: Managed Care, Other (non HMO) | Source: Home / Self Care

## 2020-08-11 ENCOUNTER — Emergency Department (INDEPENDENT_AMBULATORY_CARE_PROVIDER_SITE_OTHER): Payer: Managed Care, Other (non HMO)

## 2020-08-11 ENCOUNTER — Encounter: Payer: Self-pay | Admitting: Emergency Medicine

## 2020-08-11 DIAGNOSIS — R0602 Shortness of breath: Secondary | ICD-10-CM

## 2020-08-11 DIAGNOSIS — J00 Acute nasopharyngitis [common cold]: Secondary | ICD-10-CM

## 2020-08-11 DIAGNOSIS — J441 Chronic obstructive pulmonary disease with (acute) exacerbation: Secondary | ICD-10-CM

## 2020-08-11 DIAGNOSIS — J449 Chronic obstructive pulmonary disease, unspecified: Secondary | ICD-10-CM

## 2020-08-11 DIAGNOSIS — J329 Chronic sinusitis, unspecified: Secondary | ICD-10-CM

## 2020-08-11 MED ORDER — DOXYCYCLINE HYCLATE 100 MG PO CAPS: 100.0000 mg | ORAL_CAPSULE | Freq: Two times a day (BID) | ORAL | 0 refills | Status: DC

## 2020-08-11 MED ORDER — PREDNISONE 10 MG (21) PO TBPK: ORAL_TABLET | Freq: Every day | ORAL | 0 refills | Status: AC

## 2020-08-11 MED ORDER — METHYLPREDNISOLONE SODIUM SUCC 125 MG IJ SOLR
125.0000 mg | Freq: Once | INTRAMUSCULAR | Status: AC
  Administered 2020-08-11: 125 mg via INTRAMUSCULAR

## 2020-08-11 NOTE — Discharge Instructions (Addendum)
Chest xray today shows no pneumonia  You have received a steroid injection in the office today  I have sent in a prednisone taper for you to take for 6 days. 6 tablets on day one, 5 tablets on day two, 4 tablets on day three, 3 tablets on day four, 2 tablets on day five, and 1 tablet on day six.  I have sent in doxycycline for you to take one tablet twice a day for 10 days  Follow up with this office or with primary care if symptoms are persisting.  Follow up in the ER for high fever, trouble swallowing, trouble breathing, other concerning symptoms.

## 2020-08-11 NOTE — ED Triage Notes (Signed)
Sinus pressure & cold symptoms 2-3 weeks ago, turned into a cough last 10 days - can't get rid of it SOB increased the last few days  Hx of COPD - DOE is worse than normal COVID booster 03/2020

## 2020-08-11 NOTE — ED Provider Notes (Signed)
[facility]    CSN: [id number] Arrival date & time: 08/11/20  1333      History   Chief Complaint Chief Complaint  Patient presents with  . Shortness of Breath    HPI Willie CHIEF WALKUP is a 65 y.o. male.   Griffyn XZAYVIER Thomas is a 65 y.o. male who presents with complaint of nasal congestion, post-nasal drainage, and a persistent cough for about the last 10 days.  Reports that he is a current daily smoker.  Reports that he smokes about a pack a day.  Reports that he is having increased shortness of breath with exertion for the last few days.  Has a history of COPD, diabetes, hyperlipidemia, pneumonia.  Has negative history of COVID.  Has had COVID-vaccine and booster.  Has had flu vaccine this year.  Has taken OTC cough and cold with little relief.  Reports that cough and shortness of breath are worse with activity.  Also states that he works at a [employer] around fumes.  Denies being followed by pulmonology.  Reports that PCP manages COPD.  Denies headache, fever, body aches, known sick contacts, nausea, vomiting, diarrhea, rash, other symptoms.  ROS per HPI  The history is provided by the patient.  Shortness of Breath   Past Medical History:  Diagnosis Date  . COPD (chronic obstructive pulmonary disease) (HCC)   . Diabetes mellitus without complication (HCC)   . Hyperlipidemia   . Pneumonia     Patient Active Problem List   Diagnosis Date Noted  . Hyperlipidemia     Past Surgical History:  Procedure Laterality Date  . TONSILLECTOMY         Home Medications    Prior to Admission medications   Medication Sig Start Date End Date Taking? Authorizing Provider  albuterol (PROVENTIL HFA;VENTOLIN HFA) 108 (90 Base) MCG/ACT inhaler Inhale 1-2 puffs into the lungs every 6 (six) hours as needed for wheezing or shortness of breath. 03/27/17  Yes Phelps, Erin O, PA-C  ANORO ELLIPTA 62.5-25 MCG/INH AEPB TAKE 1 PUFF BY MOUTH EVERY DAY 11/07/18  Yes [provider]  doxycycline (VIBRAMYCIN) 100 MG capsule Take 1 capsule (100 mg total) by mouth 2 (two) times daily. 08/11/20  Yes Moshe Cipro, NP  predniSONE (STERAPRED UNI-PAK 21 TAB) 10 MG (21) TBPK tablet Take by mouth daily for 6 days. Take 6 tablets on day 1, 5 tablets on day 2, 4 tablets on day 3, 3 tablets on day 4, 2 tablets on day 5, 1 tablet on day 6 08/11/20 08/17/20 Yes Moshe Cipro, NP  atorvastatin (LIPITOR) 20 MG tablet Take 20 mg by mouth daily. Patient not taking: Reported on 08/11/2020 09/30/18   [provider]  benzonatate (TESSALON) 100 MG capsule Take 1-2 capsules (100-200 mg total) by mouth every 8 (eight) hours. Patient not taking: Reported on 08/11/2020 03/24/17   Lurene Shadow, PA-C  glipiZIDE-metformin (METAGLIP) 5-500 MG tablet TAKE 1 TABLET BY MOUTH TWICE A DAY BEFORE MEALS 07/03/18   [provider]  methocarbamol (ROBAXIN) 500 MG tablet Take 1 tablet (500 mg total) by mouth 2 (two) times daily. Patient not taking: Reported on 08/11/2020 10/04/19   Lurene Shadow, PA-C  traMADol (ULTRAM) 50 MG tablet Take 1 tablet (50 mg total) by mouth every 6 (six) hours as needed. Patient not taking: Reported on 08/11/2020 10/04/19   Lurene Shadow, PA-C  glipiZIDE (GLUCOTROL XL) 5 MG 24 hr tablet Take 5 mg by mouth daily with breakfast.  06/06/14  [provider]    Family History Family History  Problem Relation Age of Onset  . Thyroid disease Mother   . Stroke Mother   . COPD Father     Social History Social History   Tobacco Use  . Smoking status: Current Every Day Smoker    Packs/day: 1.00    Years: 35.00    Pack years: 35.00    Types: Cigarettes  . Smokeless tobacco: Never Used  Substance Use Topics  . Alcohol use: No  . Drug use: No     Allergies   Patient has no known allergies.   Review of Systems Review of Systems  Respiratory: Positive for shortness of breath.      Physical Exam Triage Vital Signs ED Triage  Vitals  Enc Vitals Group     BP 08/11/20 1345 131/77     Pulse Rate 08/11/20 1345 (!) 102     Resp 08/11/20 1345 (!) 26     Temp 08/11/20 1345 98.8 F (37.1 C)     Temp Source 08/11/20 1345 Oral     SpO2 08/11/20 1345 99 %     Weight 08/11/20 1340 261 lb (118.4 kg)     Height 08/11/20 1340 6\' 2"  (1.88 m)     Head Circumference --      Peak Flow --      Pain Score 08/11/20 1347 0     Pain Loc --      Pain Edu? --      Excl. in GC? --    No data found.  Updated Vital Signs BP 131/77 (BP Location: Right Arm)   Pulse (!) 102   Temp 98.8 F (37.1 C) (Oral)   Resp (!) 26   Ht 6\' 2"  (1.88 m)   Wt 261 lb (118.4 kg)   SpO2 99%   BMI 33.51 kg/m       Physical Exam Vitals and nursing note reviewed.  Constitutional:      General: He is not in acute distress.    Appearance: He is well-developed. He is not ill-appearing.     Interventions: He is not intubated. HENT:     Head: Normocephalic and atraumatic.     Mouth/Throat:     Mouth: Mucous membranes are moist.     Pharynx: Oropharynx is clear.  Eyes:     Extraocular Movements: Extraocular movements intact.     Conjunctiva/sclera: Conjunctivae normal.     Pupils: Pupils are equal, round, and reactive to light.  Cardiovascular:     Rate and Rhythm: Regular rhythm. Tachycardia present.     Heart sounds: No murmur heard.   Pulmonary:     Effort: Pulmonary effort is normal. Tachypnea present. No bradypnea, accessory muscle usage or respiratory distress. He is not intubated.     Breath sounds: No stridor. Examination of the right-upper field reveals wheezing. Examination of the left-upper field reveals wheezing. Examination of the left-middle field reveals wheezing. Examination of the left-lower field reveals decreased breath sounds. Decreased breath sounds and wheezing present. No rhonchi or rales.  Chest:     Chest wall: No mass, deformity, tenderness, crepitus or edema. There is no dullness to percussion.  Abdominal:      Palpations: Abdomen is soft.     Tenderness: There is no abdominal tenderness.  Musculoskeletal:        General: Normal range of motion.     Cervical back: Normal range of motion and neck supple.  Skin:  General: Skin is warm and dry.     Capillary Refill: Capillary refill takes less than 2 seconds.  Neurological:     General: No focal deficit present.     Mental Status: He is alert and oriented to person, place, and time.  Psychiatric:        Mood and Affect: Mood normal.        Behavior: Behavior normal.      UC Treatments / Results  Labs (all labs ordered are listed, but only abnormal results are displayed) Labs Reviewed - No data to display  EKG   Radiology DG Chest 2 View  Result Date: 08/11/2020 CLINICAL DATA:  COPD, increased SOB. Sinus pressure and cold symptoms for 2-3 weeks ago, turned into a cough lasting 10 days. Increased the last few days. History of COPD. Dyspnea on exertion is worse than normal. EXAM: CHEST - 2 VIEW COMPARISON:  None. FINDINGS: The heart size and mediastinal contours are within normal limits. No focal airspace disease. There is unchanged mild pulmonary hyperinflation. No acute osseous abnormality. Thoracic spondylosis. IMPRESSION: No evidence of acute cardiopulmonary disease. Electronically Signed   By: Caprice RenshawJacob  Kahn   On: 08/11/2020 14:32    Procedures Procedures (including critical care time)  Medications Ordered in UC Medications  methylPREDNISolone sodium succinate (SOLU-MEDROL) 125 mg/2 mL injection 125 mg (125 mg Intramuscular Given 08/11/20 1447)    Initial Impression / Assessment and Plan / UC Course  I have reviewed the triage vital signs and the nursing notes.  Pertinent labs & imaging results that were available during my care of the patient were reviewed by me and considered in my medical decision making (see chart for details).    COPD exacerbation Shortness of breath  Chest x-ray today negative for pneumonia Solu-Medrol  125 mg IM in office today Prescribed doxycycline 100 mg twice daily x7 days Prescribe steroid taper Discussed smoking cessation, declines Discussed establishing care with pulmonology for evaluation and maintenance therapy Follow-up with PCP or pulmonology if symptoms are persisting Follow-up with the ER for acute worsening symptoms, high fever, trouble swallowing, trouble breathing, other concerning symptoms  Final Clinical Impressions(s) / UC Diagnoses   Final diagnoses:  Shortness of breath  COPD with acute exacerbation [facility](HCC)     Discharge Instructions     Chest xray today shows no pneumonia  You have received a steroid injection in the office today  I have sent in a prednisone taper for you to take for 6 days. 6 tablets on day one, 5 tablets on day two, 4 tablets on day three, 3 tablets on day four, 2 tablets on day five, and 1 tablet on day six.  I have sent in doxycycline for you to take one tablet twice a day for 10 days  Follow up with this office or with primary care if symptoms are persisting.  Follow up in the ER for high fever, trouble swallowing, trouble breathing, other concerning symptoms.      ED Prescriptions    Medication Sig Dispense Auth. Provider   predniSONE (STERAPRED UNI-PAK 21 TAB) 10 MG (21) TBPK tablet Take by mouth daily for 6 days. Take 6 tablets on day 1, 5 tablets on day 2, 4 tablets on day 3, 3 tablets on day 4, 2 tablets on day 5, 1 tablet on day 6 21 tablet Moshe CiproMatthews, Sharlyne Koeneman, NP   doxycycline (VIBRAMYCIN) 100 MG capsule Take 1 capsule (100 mg total) by mouth 2 (two) times daily. 14 capsule Moshe CiproMatthews, Maahir Horst, NP  PDMP not reviewed this encounter.   Moshe Cipro, NP 08/11/20 1624

## 2021-02-19 ENCOUNTER — Other Ambulatory Visit: Payer: Self-pay

## 2021-02-19 ENCOUNTER — Emergency Department
Admission: EM | Admit: 2021-02-19 | Discharge: 2021-02-19 | Disposition: A | Discharge status: Home / Self Care | Payer: Managed Care, Other (non HMO) | Source: Home / Self Care | Attending: Family Medicine | Admitting: Family Medicine

## 2021-02-19 ENCOUNTER — Encounter: Payer: Self-pay | Admitting: Emergency Medicine

## 2021-02-19 DIAGNOSIS — J441 Chronic obstructive pulmonary disease with (acute) exacerbation: Secondary | ICD-10-CM

## 2021-02-19 DIAGNOSIS — J40 Bronchitis, not specified as acute or chronic: Secondary | ICD-10-CM

## 2021-02-19 MED ORDER — PREDNISONE 20 MG PO TABS: 20.0000 mg | ORAL_TABLET | Freq: Two times a day (BID) | ORAL | 0 refills | Status: DC

## 2021-02-19 MED ORDER — AMOXICILLIN 875 MG PO TABS: 875.0000 mg | ORAL_TABLET | Freq: Two times a day (BID) | ORAL | 0 refills | Status: DC

## 2021-02-19 MED ORDER — ALBUTEROL SULFATE (2.5 MG/3ML) 0.083% IN NEBU: 2.5000 mg | INHALATION_SOLUTION | Freq: Four times a day (QID) | RESPIRATORY_TRACT | 0 refills | Status: AC | PRN

## 2021-02-19 MED ORDER — NEBULIZER/TUBING/MOUTHPIECE KIT: 1.0000 | PACK | 0 refills | Status: DC | PRN

## 2021-02-19 NOTE — Discharge Instructions (Signed)
Drink lots of fluid Take the antibiotic amoxicillin 2 times a day for a week Take the prednisone 2 times a day for 5 days Continue your anoro Ellipta inhaler Use either the albuterol nebulizer, or the albuterol inhaler every 4 hours as needed for shortness of breath See your primary care doctor in follow-up

## 2021-02-19 NOTE — ED Provider Notes (Signed)
[facility]    CSN: [id number] Arrival date & time: 02/19/21  1253      History   Chief Complaint Chief Complaint  Patient presents with   URI    HPI Willie Thomas is a 65 y.o. male.   HPI  Smoker with COPD is here with cough runny nose headache and shortness of breath.  He gets short of breath with exertion.  He is taking over-the-counter cough medicine.  He has had some fever and chills but did not take his temperature.  He is using his usual inhalers.  He did do a COVID test which was negative.  He has not yet had his flu shot.  This is recommended  Past Medical History:  Diagnosis Date   COPD (chronic obstructive pulmonary disease) ([place])    Diabetes mellitus without complication ([place])    Hyperlipidemia    Pneumonia     Patient Active Problem List   Diagnosis Date Noted   Hyperlipidemia     Past Surgical History:  Procedure Laterality Date   TONSILLECTOMY         Home Medications    Prior to Admission medications   Medication Sig Start Date End Date Taking? Authorizing Provider  albuterol (PROVENTIL HFA;VENTOLIN HFA) 108 (90 Base) MCG/ACT inhaler Inhale 1-2 puffs into the lungs every 6 (six) hours as needed for wheezing or shortness of breath. 03/27/17  Yes Phelps, Erin O, PA-C  albuterol (PROVENTIL) (2.5 MG/3ML) 0.083% nebulizer solution Take 3 mLs (2.5 mg total) by nebulization every 6 (six) hours as needed for wheezing or shortness of breath. 02/19/21  Yes Raylene Everts, MD  amoxicillin (AMOXIL) 875 MG tablet Take 1 tablet (875 mg total) by mouth 2 (two) times daily. 02/19/21  Yes Raylene Everts, MD  ANORO ELLIPTA 62.5-25 MCG/INH AEPB TAKE 1 PUFF BY MOUTH EVERY DAY 11/07/18  Yes [provider]  atorvastatin (LIPITOR) 20 MG tablet Take 20 mg by mouth daily. 09/30/18  Yes [provider]  glipiZIDE-metformin (METAGLIP) 5-500 MG tablet TAKE 1 TABLET BY MOUTH TWICE A DAY BEFORE MEALS 07/03/18  Yes [provider]   predniSONE (DELTASONE) 20 MG tablet Take 1 tablet (20 mg total) by mouth 2 (two) times daily with a meal. 02/19/21  Yes Raylene Everts, MD  Respiratory Therapy Supplies (NEBULIZER/TUBING/MOUTHPIECE) KIT 1 Dose by Does not apply route every 4 (four) hours as needed. 02/19/21  Yes Raylene Everts, MD  glipiZIDE (GLUCOTROL XL) 5 MG 24 hr tablet Take 5 mg by mouth daily with breakfast.  06/06/14  [provider]    Family History Family History  Problem Relation Age of Onset   Thyroid disease Mother    Stroke Mother    COPD Father     Social History Social History   Tobacco Use   Smoking status: Every Day    Packs/day: 1.00    Years: 35.00    Pack years: 35.00    Types: Cigarettes   Smokeless tobacco: Never  Substance Use Topics   Alcohol use: No   Drug use: No     Allergies   Patient has no known allergies.   Review of Systems Review of Systems See HPI  Physical Exam Triage Vital Signs ED Triage Vitals  Enc Vitals Group     BP 02/19/21 1357 111/69     Pulse Rate 02/19/21 1357 89     Resp 02/19/21 1357 18     Temp 02/19/21 1357 98.2 F (36.8  C)     Temp Source 02/19/21 1357 Oral     SpO2 02/19/21 1357 96 %     Weight --      Height --      Head Circumference --      Peak Flow --      Pain Score 02/19/21 1356 0     Pain Loc --      Pain Edu? --      Excl. in [place]? --    No data found.  Updated Vital Signs BP 111/69 (BP Location: Right Arm)   Pulse 89   Temp 98.2 F (36.8 C) (Oral)   Resp 18   SpO2 96%       Physical Exam Constitutional:      General: He is not in acute distress.    Appearance: He is well-developed.  HENT:     Head: Normocephalic and atraumatic.     Right Ear: There is impacted cerumen.     Left Ear: There is impacted cerumen.     Nose: Congestion and rhinorrhea present.     Mouth/Throat:     Pharynx: Posterior oropharyngeal erythema present.  Eyes:     Conjunctiva/sclera: Conjunctivae normal.     Pupils:  Pupils are equal, round, and reactive to light.  Cardiovascular:     Rate and Rhythm: Normal rate and regular rhythm.     Heart sounds: Normal heart sounds.  Pulmonary:     Effort: Pulmonary effort is normal. No respiratory distress.     Breath sounds: Rhonchi present. No wheezing.  Abdominal:     General: There is no distension.     Palpations: Abdomen is soft.  Musculoskeletal:        General: Normal range of motion.     Cervical back: Normal range of motion.  Lymphadenopathy:     Cervical: No cervical adenopathy.  Skin:    General: Skin is warm and dry.  Neurological:     Mental Status: He is alert.  Psychiatric:        Mood and Affect: Mood normal.        Behavior: Behavior normal.     UC Treatments / Results  Labs (all labs ordered are listed, but only abnormal results are displayed) Labs Reviewed - No data to display  EKG   Radiology No results found.  Procedures Procedures (including critical care time)  Medications Ordered in UC Medications - No data to display  Initial Impression / Assessment and Plan / UC Course  I have reviewed the triage vital signs and the nursing notes.  Pertinent labs & imaging results that were available during my care of the patient were reviewed by me and considered in my medical decision making (see chart for details).     Smoking cessation is recommended Patient requests nebulizer.  I will order this but told him he may need insurance approval.  If this is necessary it would go through his primary care office Final Clinical Impressions(s) / UC Diagnoses   Final diagnoses:  Bronchitis  COPD exacerbation ([place])     Discharge Instructions      Drink lots of fluid Take the antibiotic amoxicillin 2 times a day for a week Take the prednisone 2 times a day for 5 days Continue your anoro Ellipta inhaler Use either the albuterol nebulizer, or the albuterol inhaler every 4 hours as needed for shortness of breath See your  primary care doctor in follow-up   ED Prescriptions  Medication Sig Dispense Auth. Provider   amoxicillin (AMOXIL) 875 MG tablet Take 1 tablet (875 mg total) by mouth 2 (two) times daily. 14 tablet Raylene Everts, MD   predniSONE (DELTASONE) 20 MG tablet Take 1 tablet (20 mg total) by mouth 2 (two) times daily with a meal. 10 tablet Raylene Everts, MD   Respiratory Therapy Supplies (NEBULIZER/TUBING/MOUTHPIECE) KIT 1 Dose by Does not apply route every 4 (four) hours as needed. 1 kit Raylene Everts, MD   albuterol (PROVENTIL) (2.5 MG/3ML) 0.083% nebulizer solution Take 3 mLs (2.5 mg total) by nebulization every 6 (six) hours as needed for wheezing or shortness of breath. 75 mL Raylene Everts, MD      PDMP not reviewed this encounter.   Raylene Everts, MD 02/19/21 [phone]

## 2021-02-19 NOTE — ED Triage Notes (Signed)
Patient presents to Urgent Care with complaints of runny nose, stuffy nose, headache, cough and chills since 1 week ago. Patient reports does have some sob with coughing spells. Taking OTC cough syrup. Using inhalers, Albuterol and Anero.  Was around sick co-worker

## 2021-09-03 IMAGING — DX DG CHEST 2V
2 series · 2 of 2 positions shown · non-contrast
Comparison: None.

CLINICAL DATA: COPD, increased SOB. Sinus pressure and cold
symptoms for 2-3 weeks ago, turned into a cough lasting 10 days.
Increased the last few days. History of COPD. Dyspnea on exertion is
worse than normal.

EXAM:
CHEST - 2 VIEW

[chest pa]
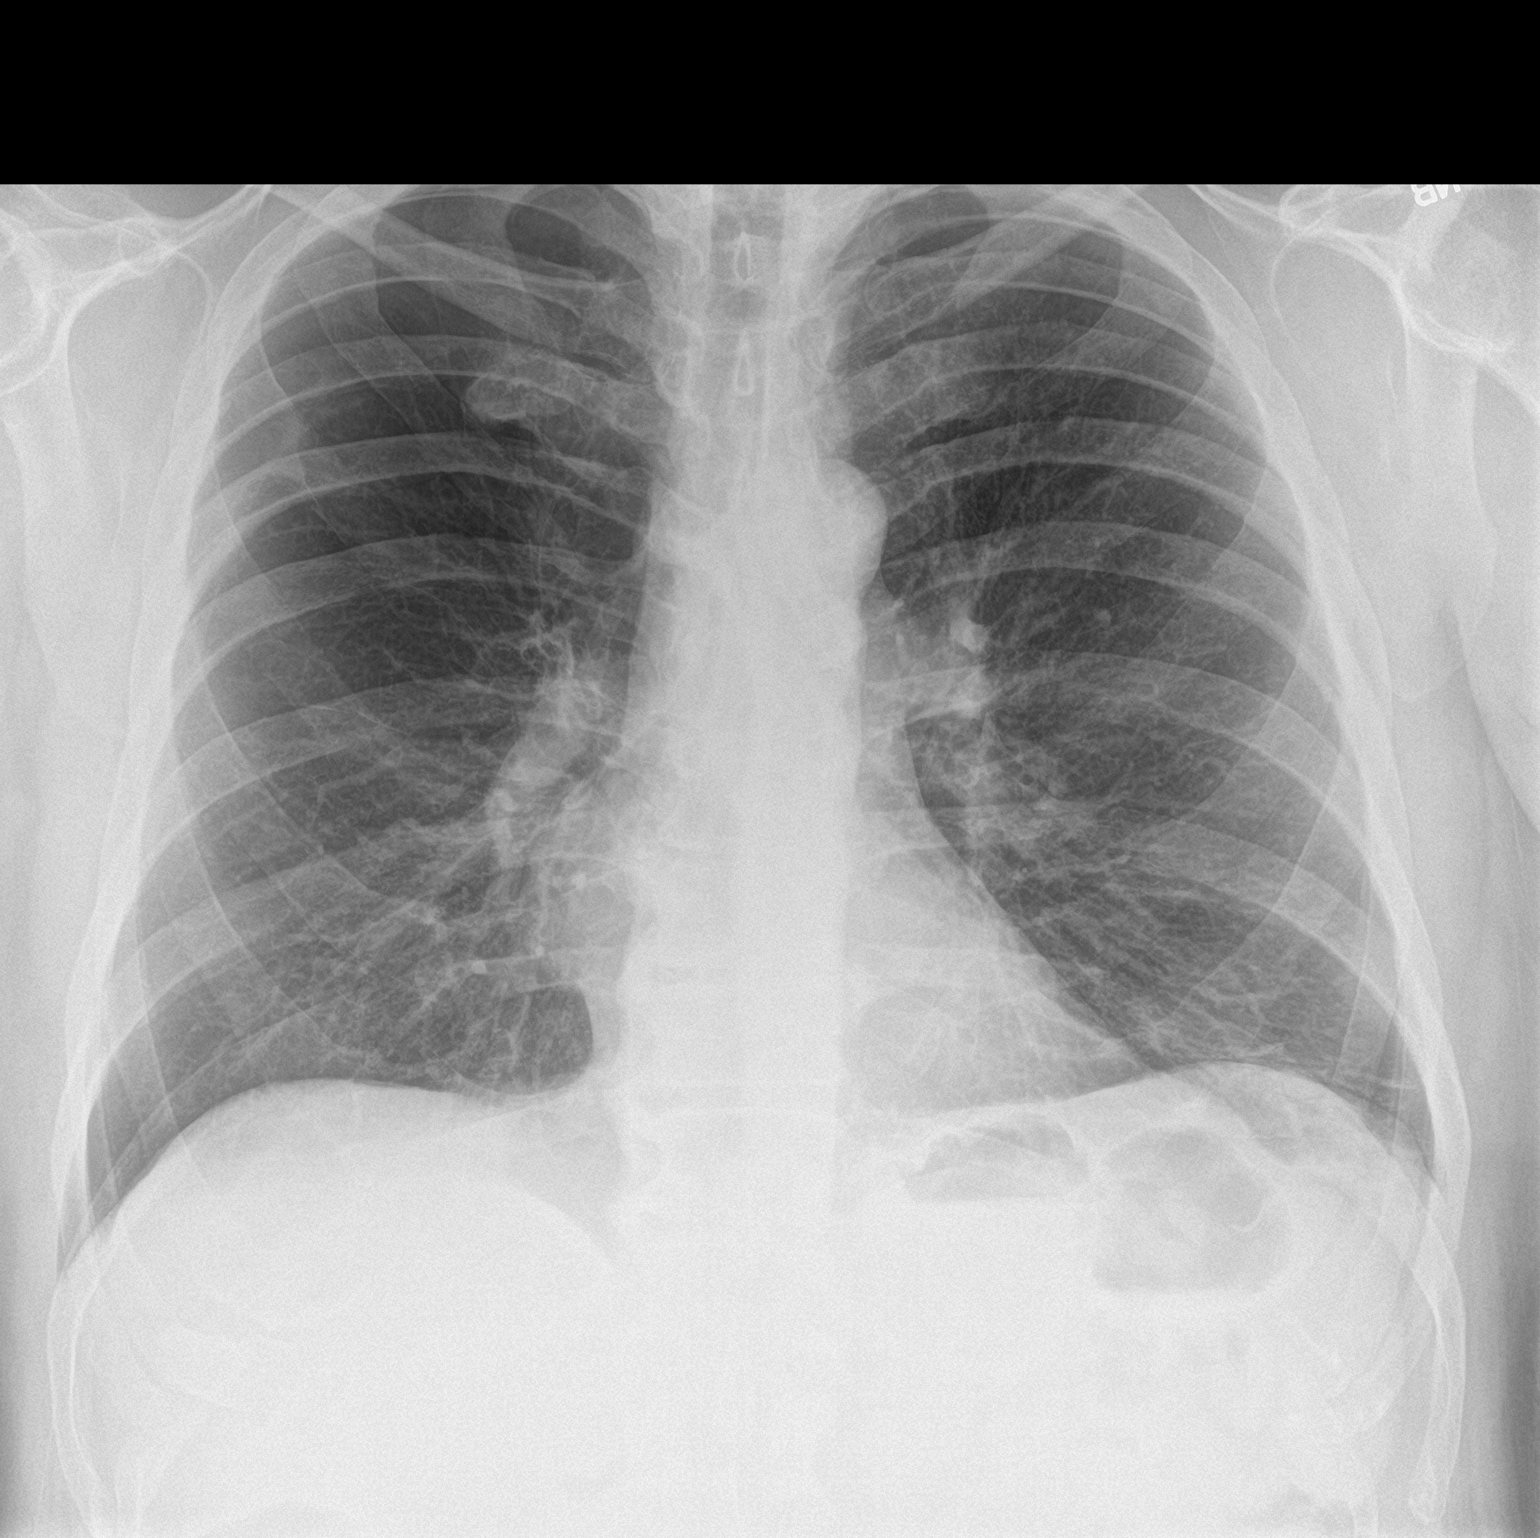

[chest lat]
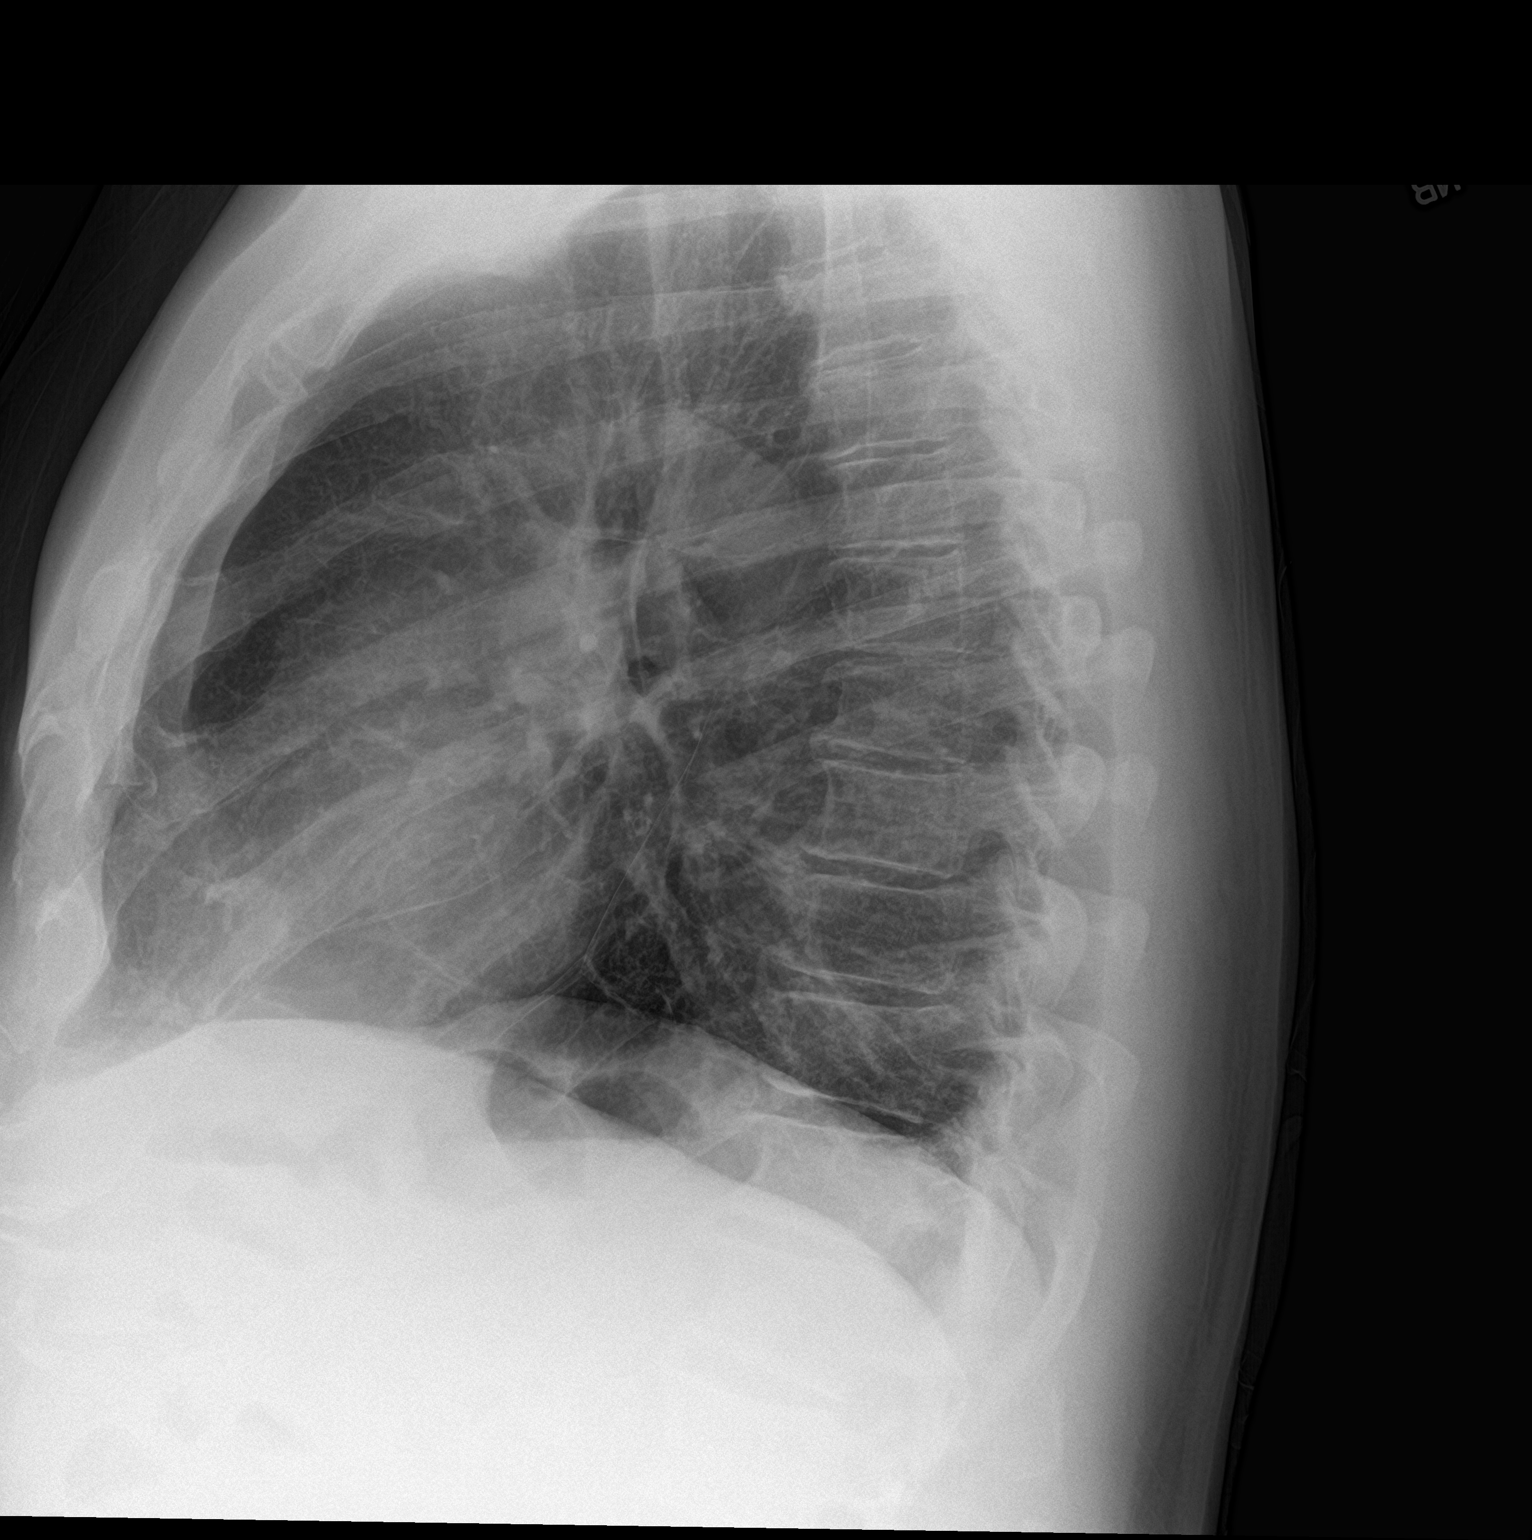

[2 of 2 positions shown; findings below may reference images not displayed]

FINDINGS: The heart size and mediastinal contours are within normal limits. No
focal airspace disease. There is unchanged mild pulmonary
hyperinflation. No acute osseous abnormality. Thoracic spondylosis.
IMPRESSION: No evidence of acute cardiopulmonary disease.

## 2022-02-18 ENCOUNTER — Encounter: Payer: Self-pay | Admitting: Emergency Medicine

## 2022-02-18 ENCOUNTER — Ambulatory Visit
Admission: EM | Admit: 2022-02-18 | Discharge: 2022-02-18 | Disposition: A | Discharge status: Home / Self Care | Payer: Managed Care, Other (non HMO) | Attending: Family Medicine | Admitting: Family Medicine

## 2022-02-18 DIAGNOSIS — E1165 Type 2 diabetes mellitus with hyperglycemia: Secondary | ICD-10-CM | POA: Diagnosis not present

## 2022-02-18 DIAGNOSIS — R110 Nausea: Secondary | ICD-10-CM | POA: Diagnosis not present

## 2022-02-18 MED ORDER — METOCLOPRAMIDE HCL 5 MG PO TABS: 5.0000 mg | ORAL_TABLET | Freq: Three times a day (TID) | ORAL | 0 refills | Status: DC

## 2022-02-18 NOTE — ED Triage Notes (Signed)
Patient states that he's had an upset stomach x 3 weeks.  Feels more nausea, no vomiting or diarrhea.  Patient having some stomach pain that comes and goes.  Has taken Pepto Bismol and drinking cranberry juice which did not resolve sx's.  Denies any urinary sx's.

## 2022-02-18 NOTE — ED Provider Notes (Signed)
[facility]    CSN: [id number] Arrival date & time: 02/18/22  1335      History   Chief Complaint Chief Complaint  Patient presents with   Nausea    HPI Willie Thomas is a 66 y.o. male.   HPI  Patient is a 66 year old smoker with diabetes and COPD.  Hyperlipidemia.  He has not had history of stomach disorder, GERD.  Is here because of stomach distress, nausea, change in appetite for the last couple of weeks.  No vomiting.  No change in bowels.  No change in diet.  Decreased appetite.  Feeling of fullness.  Feeling bloated.  No heartburn or pain.  No improvement with Pepto-Bismol. Patient is a poorly controlled diabetic with hemoglobin A1c of 8.8.  His Actos was recently doubled.  He is also on metformin.  He had not had nausea or GI symptoms on his diabetes medicine, but the medicine change happened about a week before the nausea He states he has been diabetic for 10 to 15 years.  Never been diagnosed with gastroparesis  Past Medical History:  Diagnosis Date   COPD (chronic obstructive pulmonary disease) (HCC)    Diabetes mellitus without complication (HCC)    Hyperlipidemia    Pneumonia     Patient Active Problem List   Diagnosis Date Noted   Hyperlipidemia     Past Surgical History:  Procedure Laterality Date   TONSILLECTOMY         Home Medications    Prior to Admission medications   Medication Sig Start Date End Date Taking? Authorizing Provider  albuterol (PROVENTIL HFA;VENTOLIN HFA) 108 (90 Base) MCG/ACT inhaler Inhale 1-2 puffs into the lungs every 6 (six) hours as needed for wheezing or shortness of breath. 03/27/17  Yes Phelps, Erin O, PA-C  albuterol (PROVENTIL) (2.5 MG/3ML) 0.083% nebulizer solution Take 3 mLs (2.5 mg total) by nebulization every 6 (six) hours as needed for wheezing or shortness of breath. 02/19/21  Yes Eustace Moore, MD  ANORO ELLIPTA 62.5-25 MCG/INH AEPB TAKE 1 PUFF BY MOUTH EVERY DAY 11/07/18  Yes [provider]  atorvastatin (LIPITOR) 20 MG tablet Take 20 mg by mouth daily. 09/30/18  Yes [provider]  glipiZIDE-metformin (METAGLIP) 5-500 MG tablet TAKE 1 TABLET BY MOUTH TWICE A DAY BEFORE MEALS 07/03/18  Yes [provider]  metoCLOPramide (REGLAN) 5 MG tablet Take 1 tablet (5 mg total) by mouth 4 (four) times daily -  before meals and at bedtime. 02/18/22  Yes Eustace Moore, MD  glipiZIDE (GLUCOTROL XL) 5 MG 24 hr tablet Take 5 mg by mouth daily with breakfast.  06/06/14  [provider]    Family History Family History  Problem Relation Age of Onset   Thyroid disease Mother    Stroke Mother    COPD Father     Social History Social History   Tobacco Use   Smoking status: Every Day    Packs/day: 1.00    Years: 35.00    Total pack years: 35.00    Types: Cigarettes   Smokeless tobacco: Never  Substance Use Topics   Alcohol use: No   Drug use: No     Allergies   Patient has no known allergies.   Review of Systems Review of Systems  See HPI Physical Exam Triage Vital Signs ED Triage Vitals  Enc Vitals Group     BP 02/18/22 1359 112/72     Pulse Rate 02/18/22 1359 93  Resp 02/18/22 1359 18     Temp 02/18/22 1359 99 F (37.2 C)     Temp Source 02/18/22 1359 Oral     SpO2 02/18/22 1359 96 %     Weight 02/18/22 1400 250 lb (113.4 kg)     Height 02/18/22 1400 6\' 2"  (1.88 m)     Head Circumference --      Peak Flow --      Pain Score 02/18/22 1400 0     Pain Loc --      Pain Edu? --      Excl. in [place]? --    No data found.  Updated Vital Signs BP 112/72 (BP Location: Right Arm)   Pulse 93   Temp 99 F (37.2 C) (Oral)   Resp 18   Ht 6\' 2"  (1.88 m)   Wt 113.4 kg   SpO2 96%   BMI 32.10 kg/m   Physical Exam Constitutional:      General: He is not in acute distress.    Appearance: He is well-developed.  HENT:     Head: Normocephalic and atraumatic.  Eyes:     Conjunctiva/sclera: Conjunctivae normal.     Pupils:  Pupils are equal, round, and reactive to light.  Cardiovascular:     Rate and Rhythm: Normal rate and regular rhythm.     Heart sounds: Normal heart sounds.  Pulmonary:     Effort: Pulmonary effort is normal. No respiratory distress.     Breath sounds: Normal breath sounds.  Abdominal:     General: Bowel sounds are normal. There is no distension.     Palpations: Abdomen is soft.     Tenderness: There is no abdominal tenderness.  Musculoskeletal:        General: Normal range of motion.     Cervical back: Normal range of motion and neck supple.  Skin:    General: Skin is warm and dry.  Neurological:     General: No focal deficit present.     Mental Status: He is alert.      UC Treatments / Results  Labs (all labs ordered are listed, but only abnormal results are displayed) Labs Reviewed - No data to display  EKG   Radiology No results found.  Procedures Procedures (including critical care time)  Medications Ordered in UC Medications - No data to display  Initial Impression / Assessment and Plan / UC Course  I have reviewed the triage vital signs and the nursing notes.  Pertinent labs & imaging results that were available during my care of the patient were reviewed by me and considered in my medical decision making (see chart for details).     Reviewed that this could be acid related, gastritis, ulcer disease.  Could also be gastroparesis.  Could be medicine side effect.  He needs to follow-up with his primary care doctor.  He has an appointment 2 weeks.  I notice he still smokes.  Recommended he quit.  He notes he has not had a colonoscopy.  I asked him to discuss Cologuard with his primary care provider Final Clinical Impressions(s) / UC Diagnoses   Final diagnoses:  Nausea  Poorly controlled diabetes mellitus (HCC)     Discharge Instructions      Take Reglan as directed.  This should help with your stomach discomfort  Call your primary care doctor if the  nausea persists  See your primary care doctor in 2 weeks as scheduled     ED Prescriptions  Medication Sig Dispense Auth. Provider   metoCLOPramide (REGLAN) 5 MG tablet Take 1 tablet (5 mg total) by mouth 4 (four) times daily -  before meals and at bedtime. 60 tablet Raylene Everts, MD      PDMP not reviewed this encounter.   Raylene Everts, MD 02/18/22 [phone]

## 2022-02-18 NOTE — Discharge Instructions (Signed)
Take Reglan as directed.  This should help with your stomach discomfort  Call your primary care doctor if the nausea persists  See your primary care doctor in 2 weeks as scheduled

## 2022-05-17 ENCOUNTER — Encounter: Payer: Self-pay | Admitting: Emergency Medicine

## 2022-05-17 ENCOUNTER — Ambulatory Visit
Admission: EM | Admit: 2022-05-17 | Discharge: 2022-05-17 | Disposition: A | Discharge status: Home / Self Care | Payer: Managed Care, Other (non HMO) | Attending: Family Medicine | Admitting: Family Medicine

## 2022-05-17 DIAGNOSIS — J209 Acute bronchitis, unspecified: Secondary | ICD-10-CM

## 2022-05-17 DIAGNOSIS — J441 Chronic obstructive pulmonary disease with (acute) exacerbation: Secondary | ICD-10-CM | POA: Diagnosis not present

## 2022-05-17 DIAGNOSIS — F17200 Nicotine dependence, unspecified, uncomplicated: Secondary | ICD-10-CM

## 2022-05-17 LAB — POCT INFLUENZA A/B
Influenza A, POC: NEGATIVE
Influenza B, POC: NEGATIVE

## 2022-05-17 LAB — POC SARS CORONAVIRUS 2 AG -  ED: SARS Coronavirus 2 Ag: NEGATIVE

## 2022-05-17 MED ORDER — AMOXICILLIN 875 MG PO TABS: 875.0000 mg | ORAL_TABLET | Freq: Two times a day (BID) | ORAL | 0 refills | Status: DC

## 2022-05-17 MED ORDER — PREDNISONE 20 MG PO TABS: 40.0000 mg | ORAL_TABLET | Freq: Every day | ORAL | 0 refills | Status: DC

## 2022-05-17 NOTE — ED Provider Notes (Signed)
[name]    CSN: [id number] Arrival date & time: 05/17/22  1808      History   Chief Complaint Chief Complaint  Patient presents with   Cough    HPI Willie Thomas is a 67 y.o. male.   HPI  Patient is known to me from prior visits.  He is a smoker with COPD.  He states that when he gets chest infection and it is a bronchitis and he needs antibiotics.  He is here with a cough shortness of breath head congestion and bodyaches for 2 days.  He states flu is going around the office.  No sweats chills or fever. He has been compliant with his Anoro Ellipta inhaler daily.  He is using the Proventil more than usual.  Past Medical History:  Diagnosis Date   COPD (chronic obstructive pulmonary disease) ([place])    Diabetes mellitus without complication ([place])    Hyperlipidemia    Pneumonia     Patient Active Problem List   Diagnosis Date Noted   Hyperlipidemia     Past Surgical History:  Procedure Laterality Date   TONSILLECTOMY         Home Medications    Prior to Admission medications   Medication Sig Start Date End Date Taking? Authorizing Provider  amoxicillin (AMOXIL) 875 MG tablet Take 1 tablet (875 mg total) by mouth 2 (two) times daily. 05/17/22  Yes Raylene Everts, MD  predniSONE (DELTASONE) 20 MG tablet Take 2 tablets (40 mg total) by mouth daily with breakfast. 05/17/22  Yes Raylene Everts, MD  albuterol (PROVENTIL HFA;VENTOLIN HFA) 108 (90 Base) MCG/ACT inhaler Inhale 1-2 puffs into the lungs every 6 (six) hours as needed for wheezing or shortness of breath. 03/27/17   Noe Gens, PA-C  albuterol (PROVENTIL) (2.5 MG/3ML) 0.083% nebulizer solution Take 3 mLs (2.5 mg total) by nebulization every 6 (six) hours as needed for wheezing or shortness of breath. 02/19/21   Raylene Everts, MD  ANORO ELLIPTA 62.5-25 MCG/INH AEPB TAKE 1 PUFF BY MOUTH EVERY DAY 11/07/18   [provider]  atorvastatin (LIPITOR) 20 MG tablet Take 20 mg by  mouth daily. 09/30/18   [provider]  glipiZIDE-metformin (METAGLIP) 5-500 MG tablet TAKE 1 TABLET BY MOUTH TWICE A DAY BEFORE MEALS 07/03/18   [provider]  metoCLOPramide (REGLAN) 5 MG tablet Take 1 tablet (5 mg total) by mouth 4 (four) times daily -  before meals and at bedtime. 02/18/22   Raylene Everts, MD  glipiZIDE (GLUCOTROL XL) 5 MG 24 hr tablet Take 5 mg by mouth daily with breakfast.  06/06/14  [provider]    Family History Family History  Problem Relation Age of Onset   Thyroid disease Mother    Stroke Mother    COPD Father     Social History Social History   Tobacco Use   Smoking status: Every Day    Packs/day: 1.00    Years: 35.00    Total pack years: 35.00    Types: Cigarettes   Smokeless tobacco: Never  Substance Use Topics   Alcohol use: No   Drug use: No     Allergies   Patient has no known allergies.   Review of Systems Review of Systems  See HPI Physical Exam Triage Vital Signs ED Triage Vitals  Enc Vitals Group     BP 05/17/22 1815 135/77     Pulse Rate 05/17/22 1815 97  Resp 05/17/22 1815 18     Temp 05/17/22 1815 98.1 F (36.7 C)     Temp Source 05/17/22 1815 Oral     SpO2 05/17/22 1815 98 %     Weight --      Height --      Head Circumference --      Peak Flow --      Pain Score 05/17/22 1816 0     Pain Loc --      Pain Edu? --      Excl. in [place]? --    No data found.  Updated Vital Signs BP 135/77 (BP Location: Left Arm)   Pulse 97   Temp 98.1 F (36.7 C) (Oral)   Resp 18   SpO2 98%      Physical Exam Constitutional:      General: He is not in acute distress.    Appearance: He is well-developed. He is ill-appearing.  HENT:     Head: Normocephalic and atraumatic.  Eyes:     Conjunctiva/sclera: Conjunctivae normal.     Pupils: Pupils are equal, round, and reactive to light.  Cardiovascular:     Rate and Rhythm: Normal rate.     Heart sounds: Normal heart sounds.  Pulmonary:      Effort: Pulmonary effort is normal. No respiratory distress.     Breath sounds: Rhonchi present.  Abdominal:     General: There is no distension.     Palpations: Abdomen is soft.  Musculoskeletal:        General: Normal range of motion.     Cervical back: Normal range of motion.  Skin:    General: Skin is warm and dry.  Neurological:     Mental Status: He is alert.      UC Treatments / Results  Labs (all labs ordered are listed, but only abnormal results are displayed) Labs Reviewed  POC SARS CORONAVIRUS 2 AG -  ED  POCT INFLUENZA A/B    EKG   Radiology No results found.  Procedures Procedures (including critical care time)  Medications Ordered in UC Medications - No data to display  Initial Impression / Assessment and Plan / UC Course  I have reviewed the triage vital signs and the nursing notes.  Pertinent labs & imaging results that were available during my care of the patient were reviewed by me and considered in my medical decision making (see chart for details).     COVID and flu testing are negative.  Patient has a upper respiratory infection.  I discussed with him that this is likely started by a virus but given his smoking and COPD will cover him with antibiotics.  Follow-up with PCP. Final Clinical Impressions(s) / UC Diagnoses   Final diagnoses:  COPD exacerbation ([place])  Tobacco dependence  Acute bronchitis, unspecified organism     Discharge Instructions      Drink lots of fluids Take the amoxicillin 2 times a day for a week Take prednisone once a day for 5 days May use over-the-counter cough and cold medicine See your doctor if not improving by next week     ED Prescriptions     Medication Sig Dispense Auth. Provider   amoxicillin (AMOXIL) 875 MG tablet Take 1 tablet (875 mg total) by mouth 2 (two) times daily. 14 tablet Raylene Everts, MD   predniSONE (DELTASONE) 20 MG tablet Take 2 tablets (40 mg total) by mouth daily with  breakfast. 10 tablet Raylene Everts, MD  PDMP not reviewed this encounter.   Eustace Moore, MD 05/17/22 [phone]

## 2022-05-17 NOTE — ED Triage Notes (Signed)
Pt reports a cough, SOB and head congestion. since Tuesday. States usually his cough turns into bronchitis. Reports taking Dayquil with no relief.

## 2022-05-17 NOTE — Discharge Instructions (Signed)
Drink lots of fluids Take the amoxicillin 2 times a day for a week Take prednisone once a day for 5 days May use over-the-counter cough and cold medicine See your doctor if not improving by next week

## 2022-07-31 ENCOUNTER — Ambulatory Visit
Admission: EM | Admit: 2022-07-31 | Discharge: 2022-07-31 | Disposition: A | Discharge status: Home / Self Care | Payer: Managed Care, Other (non HMO) | Attending: Family Medicine | Admitting: Family Medicine

## 2022-07-31 ENCOUNTER — Encounter: Payer: Self-pay | Admitting: Emergency Medicine

## 2022-07-31 DIAGNOSIS — F430 Acute stress reaction: Secondary | ICD-10-CM

## 2022-07-31 MED ORDER — ESCITALOPRAM OXALATE 10 MG PO TABS: 10.0000 mg | ORAL_TABLET | Freq: Every day | ORAL | 0 refills | Status: AC

## 2022-07-31 NOTE — Discharge Instructions (Signed)
Continue your current diabetes and cholesterol medications Add Lexapro (escitalopram) 10 milligrams a day This will help reduce your stress and anxiety.  It works slowly over time.  Do not be discouraged if it does not work right away  See Dr. Cyndia Diver on Tuesday, April 23 at 930

## 2022-07-31 NOTE — ED Provider Notes (Signed)
[facility]    CSN: [id number] Arrival date & time: 07/31/22  1353      History   Chief Complaint No chief complaint on file.   HPI Willie Thomas is a 67 y.o. male.   HPI  Pleasant 67 year old gentleman.  This is the third time I am seeing him in the last 6 months.  He has poorly controlled diabetes, hyperlipidemia, COPD, cigarette smoking.  He is here because he has been working forced overtime up to 72 hours a week, six 12-hour shifts in a row.  He works night shifts.  He feels like all he does is sleep.  He has diminished appetite.  Vague stomach complaints.  Feels very stressed.  Feels like his job is very pressured and becoming increasingly difficult because he is starting to feel like he cannot keep up.  He thinks it might be his age.  He is here requesting medication for "stress".  He never treated for anxiety or depression in the past.  He lives alone. I explained to him that this was a topic for his primary care doctor.  He told me that he has called his primary care doctor but is having difficulty getting an appointment.  I called his doctor's office for him and find that Dr. Cyndia Diver is in the process of retiring.  He is working limited shifts.  Past Medical History:  Diagnosis Date   COPD (chronic obstructive pulmonary disease)    Diabetes mellitus without complication    Hyperlipidemia    Pneumonia     Patient Active Problem List   Diagnosis Date Noted   Hyperlipidemia     Past Surgical History:  Procedure Laterality Date   TONSILLECTOMY         Home Medications    Prior to Admission medications   Medication Sig Start Date End Date Taking? Authorizing Provider  atorvastatin (LIPITOR) 20 MG tablet Take 1 tablet by mouth daily. 01/12/22  Yes [provider]  escitalopram (LEXAPRO) 10 MG tablet Take 1 tablet (10 mg total) by mouth daily. 07/31/22  Yes Eustace Moore, MD  glipiZIDE-metformin (METAGLIP) 5-500 MG tablet TAKE 1 TABLET BY  MOUTH TWICE A DAY BEFORE MEALS 07/03/18  Yes [provider]  pioglitazone (ACTOS) 30 MG tablet Take by mouth. 01/15/22  Yes [provider]  albuterol (PROVENTIL) (2.5 MG/3ML) 0.083% nebulizer solution Take 3 mLs (2.5 mg total) by nebulization every 6 (six) hours as needed for wheezing or shortness of breath. 02/19/21   Eustace Moore, MD  ANORO ELLIPTA 62.5-25 MCG/INH AEPB TAKE 1 PUFF BY MOUTH EVERY DAY 11/07/18   [provider]  glipiZIDE (GLUCOTROL XL) 5 MG 24 hr tablet Take 5 mg by mouth daily with breakfast.  06/06/14  [provider]    Family History Family History  Problem Relation Age of Onset   Thyroid disease Mother    Stroke Mother    COPD Father     Social History Social History   Tobacco Use   Smoking status: Every Day    Packs/day: 1.00    Years: 35.00    Additional pack years: 0.00    Total pack years: 35.00    Types: Cigarettes   Smokeless tobacco: Never  Substance Use Topics   Alcohol use: No   Drug use: No     Allergies   Patient has no known allergies.   Review of Systems Review of Systems See HPI  Physical Exam Triage Vital Signs ED  Triage Vitals [07/31/22 1408]  Enc Vitals Group     BP (!) 151/87     Pulse Rate 93     Resp 16     Temp 97.6 F (36.4 C)     Temp Source Oral     SpO2 97 %     Weight      Height      Head Circumference      Peak Flow      Pain Score 3     Pain Loc      Pain Edu?      Excl. in [place]?    No data found.  Updated Vital Signs BP (!) 151/87 (BP Location: Right Arm)   Pulse 93   Temp 97.6 F (36.4 C) (Oral)   Resp 16   SpO2 97%      Physical Exam Constitutional:      General: He is not in acute distress.    Appearance: He is well-developed and normal weight.  HENT:     Head: Normocephalic and atraumatic.  Eyes:     Conjunctiva/sclera: Conjunctivae normal.     Pupils: Pupils are equal, round, and reactive to light.  Cardiovascular:     Rate and Rhythm: Normal  rate and regular rhythm.     Heart sounds: Normal heart sounds.     Comments: Frequent ectopy Pulmonary:     Effort: Pulmonary effort is normal. No respiratory distress.     Breath sounds: Rhonchi present.  Abdominal:     General: There is no distension.     Palpations: Abdomen is soft.  Musculoskeletal:        General: Normal range of motion.     Cervical back: Normal range of motion.  Skin:    General: Skin is warm and dry.  Neurological:     Mental Status: He is alert.  Psychiatric:        Attention and Perception: Attention normal.        Mood and Affect: Mood is anxious.        Speech: Speech normal.        Behavior: Behavior normal.        Thought Content: Thought content normal.        Cognition and Memory: Cognition normal.     Comments: Patient has some anhedonia, increased sleep, decreased appetite, feelings of increased stress.  This has been ongoing for over a month.  He is requesting medication.      UC Treatments / Results  Labs (all labs ordered are listed, but only abnormal results are displayed) Labs Reviewed - No data to display  EKG   Radiology No results found.  Procedures Procedures (including critical care time)  Medications Ordered in UC Medications - No data to display  Initial Impression / Assessment and Plan / UC Course  I have reviewed the triage vital signs and the nursing notes.  Pertinent labs & imaging results that were available during my care of the patient were reviewed by me and considered in my medical decision making (see chart for details).    HPI.  I called Dr. Annett Gula office.  I have an appointment for him in a couple of weeks.  I am going to start him on Lexapro.  He understands that this will not work immediately.  We discussed risks and benefits, side effects.  Call for problems Final Clinical Impressions(s) / UC Diagnoses   Final diagnoses:  None     Discharge Instructions  Continue your current diabetes  and cholesterol medications Add Lexapro (escitalopram) 10 milligrams a day This will help reduce your stress and anxiety.  It works slowly over time.  Do not be discouraged if it does not work right away  See Dr. Cyndia DiverMcFadden on Tuesday, April 23 at 930    ED Prescriptions     Medication Sig Dispense Auth. Provider   escitalopram (LEXAPRO) 10 MG tablet Take 1 tablet (10 mg total) by mouth daily. 30 tablet Eustace MooreNelson, Anyiah Coverdale Sue, MD      PDMP not reviewed this encounter.   Eustace MooreNelson, Taegen Lennox Sue, MD 07/31/22 [phone]

## 2022-07-31 NOTE — ED Triage Notes (Addendum)
Reports a few months back was dx w pancreatitis, and has felt fatigued since. Reports his symptoms today feel similar, but not exactly the same. States over the last week he's felt very nauseous and having more frequent bowel movements. Reports an abdominal discomfort that originates in his left side. Denies noticing any changes to stool consistency or color. States he has noticed his urine appearing more bright yellow in color, but no dysuria, urinary frequency. Denies fevers, chills. Does report a decrease in appetite.
